# Patient Record
Sex: Female | Born: 1965
Health system: Southern US, Community
[De-identification: ages and names within clinical notes are randomized; demographics above are authoritative.]

## PROBLEM LIST (undated history)

## (undated) DIAGNOSIS — J189 Pneumonia, unspecified organism: Secondary | ICD-10-CM

## (undated) DIAGNOSIS — E079 Disorder of thyroid, unspecified: Secondary | ICD-10-CM

## (undated) DIAGNOSIS — G43909 Migraine, unspecified, not intractable, without status migrainosus: Secondary | ICD-10-CM

## (undated) DIAGNOSIS — K5792 Diverticulitis of intestine, part unspecified, without perforation or abscess without bleeding: Secondary | ICD-10-CM

## (undated) HISTORY — PX: TONSILLECTOMY: SUR1361

## (undated) HISTORY — DX: Disorder of thyroid, unspecified: E07.9

## (undated) HISTORY — PX: HERNIA REPAIR: SHX51

## (undated) HISTORY — PX: GALLBLADDER SURGERY: SHX652

## (undated) HISTORY — PX: BUNIONECTOMY: SHX129

## (undated) HISTORY — PX: ABDOMINAL HYSTERECTOMY: SHX81

---

## 2018-04-30 DIAGNOSIS — M9903 Segmental and somatic dysfunction of lumbar region: Secondary | ICD-10-CM | POA: Diagnosis not present

## 2018-04-30 DIAGNOSIS — M545 Low back pain: Secondary | ICD-10-CM | POA: Diagnosis not present

## 2018-04-30 DIAGNOSIS — M9901 Segmental and somatic dysfunction of cervical region: Secondary | ICD-10-CM | POA: Diagnosis not present

## 2018-04-30 DIAGNOSIS — M542 Cervicalgia: Secondary | ICD-10-CM | POA: Diagnosis not present

## 2018-05-01 DIAGNOSIS — M9901 Segmental and somatic dysfunction of cervical region: Secondary | ICD-10-CM | POA: Diagnosis not present

## 2018-05-01 DIAGNOSIS — M542 Cervicalgia: Secondary | ICD-10-CM | POA: Diagnosis not present

## 2018-05-01 DIAGNOSIS — M9903 Segmental and somatic dysfunction of lumbar region: Secondary | ICD-10-CM | POA: Diagnosis not present

## 2018-05-01 DIAGNOSIS — M545 Low back pain: Secondary | ICD-10-CM | POA: Diagnosis not present

## 2018-06-02 ENCOUNTER — Ambulatory Visit (HOSPITAL_COMMUNITY)
Admission: EM | Admit: 2018-06-02 | Discharge: 2018-06-02 | Disposition: A | Discharge status: Home / Self Care | Payer: BLUE CROSS/BLUE SHIELD | Attending: Family Medicine | Admitting: Family Medicine

## 2018-06-02 ENCOUNTER — Encounter (HOSPITAL_COMMUNITY): Payer: Self-pay | Admitting: Emergency Medicine

## 2018-06-02 DIAGNOSIS — Z760 Encounter for issue of repeat prescription: Secondary | ICD-10-CM

## 2018-06-02 DIAGNOSIS — G43709 Chronic migraine without aura, not intractable, without status migrainosus: Secondary | ICD-10-CM

## 2018-06-02 DIAGNOSIS — IMO0002 Reserved for concepts with insufficient information to code with codable children: Secondary | ICD-10-CM

## 2018-06-02 HISTORY — DX: Migraine, unspecified, not intractable, without status migrainosus: G43.909

## 2018-06-02 MED ORDER — BUSPIRONE HCL 10 MG PO TABS: 10.0000 mg | ORAL_TABLET | Freq: Two times a day (BID) | ORAL | 0 refills | Status: DC

## 2018-06-02 MED ORDER — AMITRIPTYLINE HCL 75 MG PO TABS: 75.0000 mg | ORAL_TABLET | Freq: Every day | ORAL | 0 refills | Status: DC

## 2018-06-02 MED ORDER — TOPIRAMATE 50 MG PO TABS: 100.0000 mg | ORAL_TABLET | Freq: Every day | ORAL | 0 refills | Status: DC

## 2018-06-02 NOTE — ED Triage Notes (Signed)
PT is new to [place] and has tried to find a PCP without luck. PT needs several meds refilled.

## 2018-06-02 NOTE — Discharge Instructions (Addendum)
Please continue to work to establish with a primary care provider as unable to manage medications for you long term.

## 2018-06-04 NOTE — ED Provider Notes (Signed)
[facility]    CSN: [id number] Arrival date & time: 06/02/18  1710     History   Chief Complaint Chief Complaint  Patient presents with  . Medication Refill    HPI Mary Spencer is a 52 y.o. female.   Mary Spencer presents with requests for medication refill. States she moved here a few months ago and has not been able to establish with a PCP and/or neurologist. Has followed with neurologist for chronic migraines in the past which present as dizziness/vertigo type symptoms. Still has a few days left of her medications but almost out. States she also gets injections to the base of her head which she has not been getting. Pill bottles brought it with her for review. Denies any change in her headaches currently.     ROS per HPI.      Past Medical History:  Diagnosis Date  . Migraines     There are no active problems to display for this patient.   Past Surgical History:  Procedure Laterality Date  . ABDOMINAL HYSTERECTOMY    . HERNIA REPAIR    . TONSILLECTOMY      OB History   None      Home Medications    Prior to Admission medications   Medication Sig Start Date End Date Taking? Authorizing Provider  amitriptyline (ELAVIL) 75 MG tablet Take 1 tablet (75 mg total) by mouth at bedtime. 06/02/18   Georgetta Haber, NP  busPIRone (BUSPAR) 10 MG tablet Take 1 tablet (10 mg total) by mouth 2 (two) times daily. 06/02/18   Georgetta Haber, NP  topiramate (TOPAMAX) 50 MG tablet Take 2 tablets (100 mg total) by mouth at bedtime. 06/02/18   Georgetta Haber, NP    Family History No family history on file.  Social History Social History   Tobacco Use  . Smoking status: Not on file  Substance Use Topics  . Alcohol use: Not on file  . Drug use: Not on file     Allergies   Penicillins; Erythromycin; Keflex [cephalexin]; and Sulfa antibiotics   Review of Systems Review of Systems   Physical Exam Triage Vital Signs ED Triage Vitals  Enc Vitals  Group     BP 06/02/18 1811 114/72     Pulse Rate 06/02/18 1811 66     Resp 06/02/18 1811 16     Temp 06/02/18 1811 97.8 F (36.6 C)     Temp Source 06/02/18 1811 Oral     SpO2 06/02/18 1811 100 %     Weight 06/02/18 1809 182 lb 9.6 oz (82.8 kg)     Height --      Head Circumference --      Peak Flow --      Pain Score 06/02/18 1808 3     Pain Loc --      Pain Edu? --      Excl. in GC? --    No data found.  Updated Vital Signs BP 114/72   Pulse 66   Temp 97.8 F (36.6 C) (Oral)   Resp 16   Wt 182 lb 9.6 oz (82.8 kg)   SpO2 100%    Physical Exam  Constitutional: She is oriented to person, place, and time. She appears well-developed and well-nourished. No distress.  Cardiovascular: Normal rate, regular rhythm and normal heart sounds.  Pulmonary/Chest: Effort normal and breath sounds normal.  Neurological: She is alert and oriented to person, place, and time. No cranial nerve deficit.  Coordination normal.  Skin: Skin is warm and dry.     UC Treatments / Results  Labs (all labs ordered are listed, but only abnormal results are displayed) Labs Reviewed - No data to display  EKG None  Radiology No results found.  Procedures Procedures (including critical care time)  Medications Ordered in UC Medications - No data to display  Initial Impression / Assessment and Plan / UC Course  I have reviewed the triage vital signs and the nursing notes.  Pertinent labs & imaging results that were available during my care of the patient were reviewed by me and considered in my medical decision making (see chart for details).     Elavil, buspar and topamax refilled for 30 days. Discussed at length that we are unable to manage these long term for her and emphasized the importance of following with a PCP and/or neurologist for management. Does appear she has an appointment upcoming with [facility] as well. Return precautions provided. Patient verbalized  understanding and agreeable to plan.    Final Clinical Impressions(s) / UC Diagnoses   Final diagnoses:  Medication refill  Chronic migraine     Discharge Instructions     Please continue to work to establish with a primary care provider as unable to manage medications for you long term.    ED Prescriptions    Medication Sig Dispense Auth. Provider   amitriptyline (ELAVIL) 75 MG tablet Take 1 tablet (75 mg total) by mouth at bedtime. 30 tablet Linus Mako B, NP   busPIRone (BUSPAR) 10 MG tablet Take 1 tablet (10 mg total) by mouth 2 (two) times daily. 60 tablet Linus Mako B, NP   topiramate (TOPAMAX) 50 MG tablet Take 2 tablets (100 mg total) by mouth at bedtime. 30 tablet Georgetta Haber, NP     Controlled Substance Prescriptions [place] Controlled Substance Registry consulted? Not Applicable   Georgetta Haber, NP 06/04/18 [phone]

## 2018-06-05 DIAGNOSIS — M9903 Segmental and somatic dysfunction of lumbar region: Secondary | ICD-10-CM | POA: Diagnosis not present

## 2018-06-05 DIAGNOSIS — M9902 Segmental and somatic dysfunction of thoracic region: Secondary | ICD-10-CM | POA: Diagnosis not present

## 2018-06-05 DIAGNOSIS — M9905 Segmental and somatic dysfunction of pelvic region: Secondary | ICD-10-CM | POA: Diagnosis not present

## 2018-06-05 DIAGNOSIS — M9901 Segmental and somatic dysfunction of cervical region: Secondary | ICD-10-CM | POA: Diagnosis not present

## 2018-06-10 DIAGNOSIS — M9903 Segmental and somatic dysfunction of lumbar region: Secondary | ICD-10-CM | POA: Diagnosis not present

## 2018-06-10 DIAGNOSIS — M9902 Segmental and somatic dysfunction of thoracic region: Secondary | ICD-10-CM | POA: Diagnosis not present

## 2018-06-10 DIAGNOSIS — M9905 Segmental and somatic dysfunction of pelvic region: Secondary | ICD-10-CM | POA: Diagnosis not present

## 2018-06-10 DIAGNOSIS — M9901 Segmental and somatic dysfunction of cervical region: Secondary | ICD-10-CM | POA: Diagnosis not present

## 2018-06-12 ENCOUNTER — Encounter: Payer: Self-pay | Admitting: Emergency Medicine

## 2018-06-12 ENCOUNTER — Other Ambulatory Visit: Payer: Self-pay

## 2018-06-12 ENCOUNTER — Ambulatory Visit: Payer: BLUE CROSS/BLUE SHIELD | Admitting: Emergency Medicine

## 2018-06-12 VITALS — BP 113/74 | HR 74 | Temp 97.9°F | Resp 18 | Ht 66.0 in | Wt 186.2 lb

## 2018-06-12 DIAGNOSIS — G43709 Chronic migraine without aura, not intractable, without status migrainosus: Secondary | ICD-10-CM | POA: Diagnosis not present

## 2018-06-12 DIAGNOSIS — Z23 Encounter for immunization: Secondary | ICD-10-CM | POA: Diagnosis not present

## 2018-06-12 DIAGNOSIS — Z7689 Persons encountering health services in other specified circumstances: Secondary | ICD-10-CM

## 2018-06-12 DIAGNOSIS — R42 Dizziness and giddiness: Secondary | ICD-10-CM | POA: Diagnosis not present

## 2018-06-12 MED ORDER — BUSPIRONE HCL 10 MG PO TABS: 10.0000 mg | ORAL_TABLET | Freq: Two times a day (BID) | ORAL | 1 refills | Status: AC

## 2018-06-12 MED ORDER — AMITRIPTYLINE HCL 75 MG PO TABS: 75.0000 mg | ORAL_TABLET | Freq: Every day | ORAL | 1 refills | Status: DC

## 2018-06-12 MED ORDER — TOPIRAMATE 50 MG PO TABS: 100.0000 mg | ORAL_TABLET | Freq: Every day | ORAL | 1 refills | Status: DC

## 2018-06-12 NOTE — Progress Notes (Signed)
Mary Spencer 52 y.o.   Chief Complaint  Patient presents with  . Establish Care  . Medication Refill    all meds     HISTORY OF PRESENT ILLNESS: This is a 52 y.o. female recently moved from [place] to this area.  Here to establish care.  Has a history of chronic migraine headaches presenting with dizzy spells and vertigo.  Also has a history of chronic anxiety.  Needs medication refills.  Has seen neurologist in the past.  HPI   Prior to Admission medications   Medication Sig Start Date End Date Taking? Authorizing Provider  amitriptyline (ELAVIL) 75 MG tablet Take 1 tablet (75 mg total) by mouth at bedtime. 06/02/18  Yes Burky, Dorene Grebe B, NP  busPIRone (BUSPAR) 10 MG tablet Take 1 tablet (10 mg total) by mouth 2 (two) times daily. 06/02/18  Yes Burky, Dorene Grebe B, NP  topiramate (TOPAMAX) 50 MG tablet Take 2 tablets (100 mg total) by mouth at bedtime. 06/02/18  Yes Georgetta Haber, NP    Allergies  Allergen Reactions  . Penicillins Anaphylaxis  . Erythromycin Hives  . Keflex [Cephalexin] Hives  . Sulfa Antibiotics Hives    There are no active problems to display for this patient.   Past Medical History:  Diagnosis Date  . Migraines   . Thyroid disease     Past Surgical History:  Procedure Laterality Date  . ABDOMINAL HYSTERECTOMY    . HERNIA REPAIR    . TONSILLECTOMY      Social History   Socioeconomic History  . Marital status: Married    Spouse name: Not on file  . Number of children: Not on file  . Years of education: Not on file  . Highest education level: Not on file  Occupational History  . Not on file  Social Needs  . Financial resource strain: Not on file  . Food insecurity:    Worry: Not on file    Inability: Not on file  . Transportation needs:    Medical: Not on file    Non-medical: Not on file  Tobacco Use  . Smoking status: Current Every Day Smoker    Types: Cigarettes  . Smokeless tobacco: Never Used  Substance and Sexual Activity    . Alcohol use: Not on file  . Drug use: Not on file  . Sexual activity: Not on file  Lifestyle  . Physical activity:    Days per week: Not on file    Minutes per session: Not on file  . Stress: Not on file  Relationships  . Social connections:    Talks on phone: Not on file    Gets together: Not on file    Attends religious service: Not on file    Active member of club or organization: Not on file    Attends meetings of clubs or organizations: Not on file    Relationship status: Not on file  . Intimate partner violence:    Fear of current or ex partner: Not on file    Emotionally abused: Not on file    Physically abused: Not on file    Forced sexual activity: Not on file  Other Topics Concern  . Not on file  Social History Narrative  . Not on file    No family history on file.   Review of Systems  Constitutional: Negative.  Negative for chills and fever.  HENT: Negative.   Eyes: Negative.  Negative for blurred vision and double vision.  Respiratory:  Negative.   Cardiovascular: Negative.   Gastrointestinal: Negative.  Negative for nausea and vomiting.  Skin: Negative.  Negative for rash.  Neurological: Positive for dizziness and headaches (Infrequently).    Vitals:   06/12/18 1407  BP: 113/74  Pulse: 74  Resp: 18  Temp: 97.9 F (36.6 C)  SpO2: 100%    Physical Exam  Constitutional: She is oriented to person, place, and time. She appears well-developed and well-nourished.  HENT:  Head: Normocephalic.  Right Ear: External ear normal.  Left Ear: External ear normal.  Nose: Nose normal.  Mouth/Throat: Oropharynx is clear and moist.  Eyes: Pupils are equal, round, and reactive to light. Conjunctivae and EOM are normal.  Neck: Normal range of motion. Neck supple.  Cardiovascular: Normal rate, regular rhythm and normal heart sounds.  Pulmonary/Chest: Effort normal and breath sounds normal.  Musculoskeletal: Normal range of motion.  Neurological: She is alert  and oriented to person, place, and time. No sensory deficit. She exhibits normal muscle tone.  Skin: Skin is warm and dry. Capillary refill takes less than 2 seconds.  Psychiatric: She has a normal mood and affect. Her behavior is normal.  Vitals reviewed.    ASSESSMENT & PLAN: Rachyl was seen today for establish care and medication refill.  Diagnoses and all orders for this visit:  Chronic migraine without aura without status migrainosus, not intractable -     Ambulatory referral to Neurology -     topiramate (TOPAMAX) 50 MG tablet; Take 2 tablets (100 mg total) by mouth at bedtime. -     busPIRone (BUSPAR) 10 MG tablet; Take 1 tablet (10 mg total) by mouth 2 (two) times daily. -     amitriptyline (ELAVIL) 75 MG tablet; Take 1 tablet (75 mg total) by mouth at bedtime.  Flu vaccine need -     Flu Vaccine QUAD 36+ mos IM  Chronic vertigo  Encounter to establish care    Patient Instructions       If you have lab work done today you will be contacted with your lab results within the next 2 weeks.  If you have not heard from [place] then please contact us. The fastest way to get your results is to register for My Chart.   IF you received an x-ray today, you will receive an invoice from [facility]. Please contact [facility] at [phone] with questions or concerns regarding your invoice.   IF you received labwork today, you will receive an invoice from [facility]. Please contact [facility] at [phone] with questions or concerns regarding your invoice.   Our billing staff will not be able to assist you with questions regarding bills from these companies.  You will be contacted with the lab results as soon as they are available. The fastest way to get your results is to activate your My Chart account. Instructions are located on the last page of this paperwork. If you have not heard from [place] regarding the results in 2 weeks, please contact this office.      Migraine Headache A migraine headache is a very strong throbbing pain on one side or both sides of your head. Migraines can also cause other symptoms. Talk with your doctor about what things may bring on (trigger) your migraine headaches. Follow these instructions at home: Medicines  Take over-the-counter and prescription medicines only as told by your doctor.  Do not drive or use heavy machinery while taking prescription pain medicine.  To prevent or treat constipation while you  are taking prescription pain medicine, your doctor may recommend that you: ? Drink enough fluid to keep your pee (urine) clear or pale yellow. ? Take over-the-counter or prescription medicines. ? Eat foods that are high in fiber. These include fresh fruits and vegetables, whole grains, and beans. ? Limit foods that are high in fat and processed sugars. These include fried and sweet foods. Lifestyle  Avoid alcohol.  Do not use any products that contain nicotine or tobacco, such as cigarettes and e-cigarettes. If you need help quitting, ask your doctor.  Get at least 8 hours of sleep every night.  Limit your stress. General instructions   Keep a journal to find out what may bring on your migraines. For example, write down: ? What you eat and drink. ? How much sleep you get. ? Any change in what you eat or drink. ? Any change in your medicines.  If you have a migraine: ? Avoid things that make your symptoms worse, such as bright lights. ? It may help to lie down in a dark, quiet room. ? Do not drive or use heavy machinery. ? Ask your doctor what activities are safe for you.  Keep all follow-up visits as told by your doctor. This is important. Contact a doctor if:  You get a migraine that is different or worse than your usual migraines. Get help right away if:  Your migraine gets very bad.  You have a fever.  You have a stiff neck.  You have trouble seeing.  Your muscles feel weak or  like you cannot control them.  You start to lose your balance a lot.  You start to have trouble walking.  You pass out (faint). This information is not intended to replace advice given to you by your health care provider. Make sure you discuss any questions you have with your health care provider. Document Released: 05/29/2008 Document Revised: 03/09/2016 Document Reviewed: 02/06/2016 Elsevier Interactive Patient Education  2018 Elsevier Inc.       Edwina Barth, MD [facility]

## 2018-06-12 NOTE — Patient Instructions (Addendum)
If you have lab work done today you will be contacted with your lab results within the next 2 weeks.  If you have not heard from [place] then please contact us. The fastest way to get your results is to register for My Chart.   IF you received an x-ray today, you will receive an invoice from [facility]. Please contact [facility] at [phone] with questions or concerns regarding your invoice.   IF you received labwork today, you will receive an invoice from [facility]. Please contact [facility] at [phone] with questions or concerns regarding your invoice.   Our billing staff will not be able to assist you with questions regarding bills from these companies.  You will be contacted with the lab results as soon as they are available. The fastest way to get your results is to activate your My Chart account. Instructions are located on the last page of this paperwork. If you have not heard from [place] regarding the results in 2 weeks, please contact this office.     Migraine Headache A migraine headache is a very strong throbbing pain on one side or both sides of your head. Migraines can also cause other symptoms. Talk with your doctor about what things may bring on (trigger) your migraine headaches. Follow these instructions at home: Medicines  Take over-the-counter and prescription medicines only as told by your doctor.  Do not drive or use heavy machinery while taking prescription pain medicine.  To prevent or treat constipation while you are taking prescription pain medicine, your doctor may recommend that you: ? Drink enough fluid to keep your pee (urine) clear or pale yellow. ? Take over-the-counter or prescription medicines. ? Eat foods that are high in fiber. These include fresh fruits and vegetables, whole grains, and beans. ? Limit foods that are high in fat and processed sugars. These include fried and sweet foods. Lifestyle  Avoid alcohol.  Do not use any  products that contain nicotine or tobacco, such as cigarettes and e-cigarettes. If you need help quitting, ask your doctor.  Get at least 8 hours of sleep every night.  Limit your stress. General instructions   Keep a journal to find out what may bring on your migraines. For example, write down: ? What you eat and drink. ? How much sleep you get. ? Any change in what you eat or drink. ? Any change in your medicines.  If you have a migraine: ? Avoid things that make your symptoms worse, such as bright lights. ? It may help to lie down in a dark, quiet room. ? Do not drive or use heavy machinery. ? Ask your doctor what activities are safe for you.  Keep all follow-up visits as told by your doctor. This is important. Contact a doctor if:  You get a migraine that is different or worse than your usual migraines. Get help right away if:  Your migraine gets very bad.  You have a fever.  You have a stiff neck.  You have trouble seeing.  Your muscles feel weak or like you cannot control them.  You start to lose your balance a lot.  You start to have trouble walking.  You pass out (faint). This information is not intended to replace advice given to you by your health care provider. Make sure you discuss any questions you have with your health care provider. Document Released: 05/29/2008 Document Revised: 03/09/2016 Document Reviewed: 02/06/2016 Elsevier Interactive Patient Education  2018 [employer].  Coping with Quitting Smoking Quitting smoking is a physical and mental challenge. You will face cravings, withdrawal symptoms, and temptation. Before quitting, work with your health care provider to make a plan that can help you cope. Preparation can help you quit and keep you from giving in. How can I cope with cravings? Cravings usually last for 5-10 minutes. If you get through it, the craving will pass. Consider taking the following actions to help you cope with  cravings:  Keep your mouth busy: ? Chew sugar-free gum. ? Suck on hard candies or a straw. ? Brush your teeth.  Keep your hands and body busy: ? Immediately change to a different activity when you feel a craving. ? Squeeze or play with a ball. ? Do an activity or a hobby, like making bead jewelry, practicing needlepoint, or working with wood. ? Mix up your normal routine. ? Take a short exercise break. Go for a quick walk or run up and down stairs. ? Spend time in public places where smoking is not allowed.  Focus on doing something kind or helpful for someone else.  Call a friend or family member to talk during a craving.  Join a support group.  Call a quit line, such as 1-800-QUIT-NOW.  Talk with your health care provider about medicines that might help you cope with cravings and make quitting easier for you.  How can I deal with withdrawal symptoms? Your body may experience negative effects as it tries to get used to not having nicotine in the system. These effects are called withdrawal symptoms. They may include:  Feeling hungrier than normal.  Trouble concentrating.  Irritability.  Trouble sleeping.  Feeling depressed.  Restlessness and agitation.  Craving a cigarette.  To manage withdrawal symptoms:  Avoid places, people, and activities that trigger your cravings.  Remember why you want to quit.  Get plenty of sleep.  Avoid coffee and other caffeinated drinks. These may worsen some of your symptoms.  How can I handle social situations? Social situations can be difficult when you are quitting smoking, especially in the first few weeks. To manage this, you can:  Avoid parties, bars, and other social situations where people might be smoking.  Avoid alcohol.  Leave right away if you have the urge to smoke.  Explain to your family and friends that you are quitting smoking. Ask for understanding and support.  Plan activities with friends or family where  smoking is not an option.  What are some ways I can cope with stress? Wanting to smoke may cause stress, and stress can make you want to smoke. Find ways to manage your stress. Relaxation techniques can help. For example:  Breathe slowly and deeply, in through your nose and out through your mouth.  Listen to soothing, relaxing music.  Talk with a family member or friend about your stress.  Light a candle.  Soak in a bath or take a shower.  Think about a peaceful place.  What are some ways I can prevent weight gain? Be aware that many people gain weight after they quit smoking. However, not everyone does. To keep from gaining weight, have a plan in place before you quit and stick to the plan after you quit. Your plan should include:  Having healthy snacks. When you have a craving, it may help to: ? Eat plain popcorn, crunchy carrots, celery, or other cut vegetables. ? Chew sugar-free gum.  Changing how you eat: ? Eat small portion sizes at meals. ?  Eat 4-6 small meals throughout the day instead of 1-2 large meals a day. ? Be mindful when you eat. Do not watch television or do other things that might distract you as you eat.  Exercising regularly: ? Make time to exercise each day. If you do not have time for a long workout, do short bouts of exercise for 5-10 minutes several times a day. ? Do some form of strengthening exercise, like weight lifting, and some form of aerobic exercise, like running or swimming.  Drinking plenty of water or other low-calorie or no-calorie drinks. Drink 6-8 glasses of water daily, or as much as instructed by your health care provider.  Summary  Quitting smoking is a physical and mental challenge. You will face cravings, withdrawal symptoms, and temptation to smoke again. Preparation can help you as you go through these challenges.  You can cope with cravings by keeping your mouth busy (such as by chewing gum), keeping your body and hands busy, and  making calls to family, friends, or a helpline for people who want to quit smoking.  You can cope with withdrawal symptoms by avoiding places where people smoke, avoiding drinks with caffeine, and getting plenty of rest.  Ask your health care provider about the different ways to prevent weight gain, avoid stress, and handle social situations. This information is not intended to replace advice given to you by your health care provider. Make sure you discuss any questions you have with your health care provider. Document Released: 08/17/2016 Document Revised: 08/17/2016 Document Reviewed: 08/17/2016 Elsevier Interactive Patient Education  [employer].

## 2018-06-17 DIAGNOSIS — M9902 Segmental and somatic dysfunction of thoracic region: Secondary | ICD-10-CM | POA: Diagnosis not present

## 2018-06-17 DIAGNOSIS — M9901 Segmental and somatic dysfunction of cervical region: Secondary | ICD-10-CM | POA: Diagnosis not present

## 2018-06-17 DIAGNOSIS — M9903 Segmental and somatic dysfunction of lumbar region: Secondary | ICD-10-CM | POA: Diagnosis not present

## 2018-06-17 DIAGNOSIS — M9905 Segmental and somatic dysfunction of pelvic region: Secondary | ICD-10-CM | POA: Diagnosis not present

## 2018-06-23 ENCOUNTER — Ambulatory Visit: Payer: Self-pay | Admitting: Nurse Practitioner

## 2018-06-25 DIAGNOSIS — M9903 Segmental and somatic dysfunction of lumbar region: Secondary | ICD-10-CM | POA: Diagnosis not present

## 2018-06-25 DIAGNOSIS — M9901 Segmental and somatic dysfunction of cervical region: Secondary | ICD-10-CM | POA: Diagnosis not present

## 2018-06-25 DIAGNOSIS — M9905 Segmental and somatic dysfunction of pelvic region: Secondary | ICD-10-CM | POA: Diagnosis not present

## 2018-06-25 DIAGNOSIS — M9902 Segmental and somatic dysfunction of thoracic region: Secondary | ICD-10-CM | POA: Diagnosis not present

## 2018-07-08 DIAGNOSIS — M9901 Segmental and somatic dysfunction of cervical region: Secondary | ICD-10-CM | POA: Diagnosis not present

## 2018-07-08 DIAGNOSIS — M9902 Segmental and somatic dysfunction of thoracic region: Secondary | ICD-10-CM | POA: Diagnosis not present

## 2018-07-08 DIAGNOSIS — M9905 Segmental and somatic dysfunction of pelvic region: Secondary | ICD-10-CM | POA: Diagnosis not present

## 2018-07-08 DIAGNOSIS — M9903 Segmental and somatic dysfunction of lumbar region: Secondary | ICD-10-CM | POA: Diagnosis not present

## 2018-07-16 DIAGNOSIS — M9903 Segmental and somatic dysfunction of lumbar region: Secondary | ICD-10-CM | POA: Diagnosis not present

## 2018-07-16 DIAGNOSIS — M9905 Segmental and somatic dysfunction of pelvic region: Secondary | ICD-10-CM | POA: Diagnosis not present

## 2018-07-16 DIAGNOSIS — M9902 Segmental and somatic dysfunction of thoracic region: Secondary | ICD-10-CM | POA: Diagnosis not present

## 2018-07-16 DIAGNOSIS — M9901 Segmental and somatic dysfunction of cervical region: Secondary | ICD-10-CM | POA: Diagnosis not present

## 2018-07-23 DIAGNOSIS — M9901 Segmental and somatic dysfunction of cervical region: Secondary | ICD-10-CM | POA: Diagnosis not present

## 2018-07-23 DIAGNOSIS — M9905 Segmental and somatic dysfunction of pelvic region: Secondary | ICD-10-CM | POA: Diagnosis not present

## 2018-07-23 DIAGNOSIS — M9902 Segmental and somatic dysfunction of thoracic region: Secondary | ICD-10-CM | POA: Diagnosis not present

## 2018-07-23 DIAGNOSIS — M9903 Segmental and somatic dysfunction of lumbar region: Secondary | ICD-10-CM | POA: Diagnosis not present

## 2018-08-05 ENCOUNTER — Ambulatory Visit: Payer: BLUE CROSS/BLUE SHIELD | Admitting: Neurology

## 2018-08-05 DIAGNOSIS — M9903 Segmental and somatic dysfunction of lumbar region: Secondary | ICD-10-CM | POA: Diagnosis not present

## 2018-08-05 DIAGNOSIS — M9902 Segmental and somatic dysfunction of thoracic region: Secondary | ICD-10-CM | POA: Diagnosis not present

## 2018-08-05 DIAGNOSIS — M9905 Segmental and somatic dysfunction of pelvic region: Secondary | ICD-10-CM | POA: Diagnosis not present

## 2018-08-05 DIAGNOSIS — M9901 Segmental and somatic dysfunction of cervical region: Secondary | ICD-10-CM | POA: Diagnosis not present

## 2018-08-14 DIAGNOSIS — M9901 Segmental and somatic dysfunction of cervical region: Secondary | ICD-10-CM | POA: Diagnosis not present

## 2018-08-14 DIAGNOSIS — M9903 Segmental and somatic dysfunction of lumbar region: Secondary | ICD-10-CM | POA: Diagnosis not present

## 2018-08-14 DIAGNOSIS — M9905 Segmental and somatic dysfunction of pelvic region: Secondary | ICD-10-CM | POA: Diagnosis not present

## 2018-08-14 DIAGNOSIS — M9902 Segmental and somatic dysfunction of thoracic region: Secondary | ICD-10-CM | POA: Diagnosis not present

## 2018-09-04 DIAGNOSIS — M9901 Segmental and somatic dysfunction of cervical region: Secondary | ICD-10-CM | POA: Diagnosis not present

## 2018-09-04 DIAGNOSIS — M9905 Segmental and somatic dysfunction of pelvic region: Secondary | ICD-10-CM | POA: Diagnosis not present

## 2018-09-04 DIAGNOSIS — M9903 Segmental and somatic dysfunction of lumbar region: Secondary | ICD-10-CM | POA: Diagnosis not present

## 2018-09-04 DIAGNOSIS — M9902 Segmental and somatic dysfunction of thoracic region: Secondary | ICD-10-CM | POA: Diagnosis not present

## 2018-09-11 DIAGNOSIS — M9903 Segmental and somatic dysfunction of lumbar region: Secondary | ICD-10-CM | POA: Diagnosis not present

## 2018-09-11 DIAGNOSIS — M9905 Segmental and somatic dysfunction of pelvic region: Secondary | ICD-10-CM | POA: Diagnosis not present

## 2018-09-11 DIAGNOSIS — M9901 Segmental and somatic dysfunction of cervical region: Secondary | ICD-10-CM | POA: Diagnosis not present

## 2018-09-11 DIAGNOSIS — M9902 Segmental and somatic dysfunction of thoracic region: Secondary | ICD-10-CM | POA: Diagnosis not present

## 2018-09-30 NOTE — Progress Notes (Signed)
[facility]    Provider:  Dr Lucia Gaskins Referring Provider: Georgina Quint, * Primary Care Physician:  Georgina Quint, *  CC:  Migraines  HPI:  Mary Spencer is a 53 y.o. female here as requested by Dr. Alvy Bimler for chronic migraines.  Past medical history of chronic migraines presenting with dizzy spells and vertigo, chronic anxiety.  She has been managed by neurology in the past for her migraines.  She also has a history of thyroid disease.She has been to ENT multiple times and several of them, she has had extensive cardiac workup. 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 She can have headaches. Maxalt works well. Headaches are pulsating and pounding, light and sound sensitivity and nausea and vomiting.  She has daily vertigo and dizziness, it is just normal to her and she will set her hand on something and sits a lot, She has headaches which are minor 12 days a month and her main symptoms daily are vestibular. Nerve blocks helped in the past every 3 months occipital nerve blocks. She ses a [facility]. She had a neurologist in [place]. She sees Dr. Lois Huxley chiropractor. Migraines can last 24-72 hours. She is on Topiramate and Alavil. No new symptoms or focal deficits, no hearing changes, no vision changes. She had had extended eeg, multiple MRIs. No other focal neurologic deficits, associated symptoms, inciting events or modifiable factors.  Meds tried: Elavil, Topiramate  Reviewed notes, labs and imaging from outside physicians, which showed:  Reviewed emergency room notes patient was seen in September 2019.  Patient moved to the area several months prior and had not been able to establish with a PCP.  She is followed with a neurologist for  chronic migraine in the past which presented as dizziness and vertigo type symptoms.  Also stated she gets injections to the base of her head which she has not been getting.  No changes in her headaches currently.  She is on amitriptyline, topiramate and BuSpar.  Vitals, labs and exam were normal in the emergency room.  Patient was seen at Dr. Latrelle Dodrill office to establish care in October 2019.  She recently moved to this area from [place].  Has a history of chronic migraines presenting with dizzy spells and vertigo.  Also has a history of chronic anxiety.  Has seen neurology in the past.  Stated she had dizziness and headaches infrequently.  Physical exam and neurologic exam was normal.  No changes were made to her medications.  Will request notes from prior neurologist's office  Review of Systems: Patient complains of symptoms per HPI as well as the following symptoms: headache, dizziness. Pertinent negatives and positives per HPI. All others negative.   Social History   Socioeconomic History  . Marital status: Married    Spouse name: Not on file  . Number of children: 2  . Years of education: Not on file  . Highest education level: Bachelor's degree (e.g., BA, AB, BS)  Occupational History  . Not on file  Social Needs  . Financial resource strain: Not on file  . Food insecurity:    Worry: Not on file    Inability: Not on file  . Transportation needs:    Medical: Not on file    Non-medical: Not on file  Tobacco  Use  . Smoking status: Current Every Day Smoker    Packs/day: 0.50    Types: Cigarettes  . Smokeless tobacco: Never Used  Substance and Sexual Activity  . Alcohol use: Yes    Comment: "very rarely, maybe 1 drink on a holiday"  . Drug use: Never  . Sexual activity: Not on file  Lifestyle  . Physical activity:    Days per week: Not on file    Minutes per session: Not on file  . Stress: Not on file  Relationships  . Social connections:    Talks on phone: Not on  file    Gets together: Not on file    Attends religious service: Not on file    Active member of club or organization: Not on file    Attends meetings of clubs or organizations: Not on file    Relationship status: Not on file  . Intimate partner violence:    Fear of current or ex partner: Not on file    Emotionally abused: Not on file    Physically abused: Not on file    Forced sexual activity: Not on file  Other Topics Concern  . Not on file  Social History Narrative   Lives at home with her husband    Right handed   Caffeine: 2-3 cups daily    Family History  Problem Relation Age of Onset  . Stroke Mother   . Seizures Mother   . Cancer Mother   . Macular degeneration Father   . Migraines Sister        cluster  . Stroke Maternal Grandmother   . Seizures Maternal Grandmother   . Cancer Maternal Grandfather   . Migraines Sister        cluster    Past Medical History:  Diagnosis Date  . Migraines   . Thyroid disease     Past Surgical History:  Procedure Laterality Date  . ABDOMINAL HYSTERECTOMY    . HERNIA REPAIR    . TONSILLECTOMY      Current Outpatient Medications  Medication Sig Dispense Refill  . amitriptyline (ELAVIL) 75 MG tablet Take 1 tablet (75 mg total) by mouth at bedtime. 90 tablet 1  . busPIRone (BUSPAR) 10 MG tablet Take 10 mg by mouth 2 (two) times daily.    Marland Kitchen. topiramate (TOPAMAX) 50 MG tablet Take 2 tablets (100 mg total) by mouth at bedtime. 180 tablet 1  . Erenumab-aooe (AIMOVIG, 140 MG DOSE,) 70 MG/ML SOAJ Inject 140 mg into the skin every 30 (thirty) days. 1 pen 11   No current facility-administered medications for this visit.     Allergies as of 10/01/2018 - Review Complete 10/01/2018  Allergen Reaction Noted  . Penicillins Anaphylaxis 06/02/2018  . Erythromycin Hives 06/02/2018  . Keflex [cephalexin] Hives 06/02/2018  . Sulfa antibiotics Hives 06/02/2018    Vitals: BP 102/63 (BP Location: Right Arm, Patient Position: Sitting)    Pulse 89   Ht 5\' 6"  (1.676 m)   Wt 187 lb (84.8 kg)   BMI 30.18 kg/m  Last Weight:  Wt Readings from Last 1 Encounters:  10/01/18 187 lb (84.8 kg)   Last Height:   Ht Readings from Last 1 Encounters:  10/01/18 5\' 6"  (1.676 m)    Physical exam: Exam: Gen: NAD, conversant, well nourised, obese, well groomed                     CV: RRR, no MRG. No Carotid Bruits. No peripheral  edema, warm, nontender Eyes: Conjunctivae clear without exudates or hemorrhage  Neuro: Detailed Neurologic Exam  Speech:    Speech is normal; fluent and spontaneous with normal comprehension.  Cognition:    The patient is oriented to person, place, and time;     recent and remote memory intact;     language fluent;     normal attention, concentration,     fund of knowledge Cranial Nerves:    The pupils are equal, round, and reactive to light. The fundi are normal and spontaneous venous pulsations are present. Visual fields are full to finger confrontation. Extraocular movements are intact. Trigeminal sensation is intact and the muscles of mastication are normal. The face is symmetric. The palate elevates in the midline. Hearing intact. Voice is normal. Shoulder shrug is normal. The tongue has normal motion without fasciculations.   Coordination:    Normal finger to nose and heel to shin. Normal rapid alternating movements.   Gait:    Heel-toe and tandem gait are normal.   Motor Observation:    No asymmetry, no atrophy, and no involuntary movements noted. Tone:    Normal muscle tone.    Posture:    Posture is normal. normal erect    Strength:    Strength is V/V in the upper and lower limbs.      Sensation: intact to LT     Reflex Exam:  DTR's:    Deep tendon reflexes in the upper and lower extremities are normal bilaterally.   Toes:    The toes are downgoing bilaterally.   Clonus:    Clonus is absent.    Assessment/Plan:  Patient with chronic migraines and associated dizziness or  "vestibular migraines" as it is coined. She has daily symptoms. Will try Aimovig, also discussed botox. There is no data these work in primarily vestibular-type migraines however it is worth a try.   continue current medications: Elavil, Topiramate Acutely: Maxalt and phenergan  Discussed: There is increased risk for stroke in women with migraine with aura and a  Contraindication for the combined contraceptive pill for use by women who have migraine with aura, which is in line with World Health Organisation recommendations. The risk for women with migraine without aura is lower and other risk factors like smoking are far more likely to increase stroke risk than migraine. There is a recommendation for no smoking and for the use of low estrogen or progestogen only pills particularly for women with migraine with aura. It is important however that women with migraine who are taking the pill do not decide to suddenly stop taking it without discussing this with their doctor. Please discuss with her OB/GYN.  Discussed: To prevent or relieve headaches, try the following: Cool Compress. Lie down and place a cool compress on your head.  Avoid headache triggers. If certain foods or odors seem to have triggered your migraines in the past, avoid them. A headache diary might help you identify triggers.  Include physical activity in your daily routine. Try a daily walk or other moderate aerobic exercise.  Manage stress. Find healthy ways to cope with the stressors, such as delegating tasks on your to-do list.  Practice relaxation techniques. Try deep breathing, yoga, massage and visualization.  Eat regularly. Eating regularly scheduled meals and maintaining a healthy diet might help prevent headaches. Also, drink plenty of fluids.  Follow a regular sleep schedule. Sleep deprivation might contribute to headaches Consider biofeedback. With this mind-body technique, you learn to control certain bodily  functions - such  as muscle tension, heart rate and blood pressure - to prevent headaches or reduce headache pain.    Proceed to emergency room if you experience new or worsening symptoms or symptoms do not resolve, if you have new neurologic symptoms or if headache is severe, or for any concerning symptom.   Provided education and documentation from American headache Society toolbox including articles on: chronic migraine medication overuse headache, chronic migraines, prevention of migraines, behavioral and other nonpharmacologic treatments for headache.  Meds ordered this encounter  Medications  . Erenumab-aooe (AIMOVIG, 140 MG DOSE,) 70 MG/ML SOAJ    Sig: Inject 140 mg into the skin every 30 (thirty) days.    Dispense:  1 pen    Refill:  11    Patient has a copay card and she can have the medication regardless of insurance approval or copay  . topiramate (TOPAMAX) 100 MG tablet    Sig: Take 1 tablet (100 mg total) by mouth at bedtime.    Dispense:  90 tablet    Refill:  4  . amitriptyline (ELAVIL) 75 MG tablet    Sig: Take 1 tablet (75 mg total) by mouth at bedtime.    Dispense:  90 tablet    Refill:  4  . rizatriptan (MAXALT-MLT) 10 MG disintegrating tablet    Sig: Take 1 tablet (10 mg total) by mouth as needed for migraine. May repeat in 2 hours if needed    Dispense:  27 tablet    Refill:  4    This is a 90-day supply do not fill early  . promethazine (PHENERGAN) 12.5 MG tablet    Sig: Take 1 tablet (12.5 mg total) by mouth every 6 (six) hours as needed for nausea or vomiting. May take with Maxalt (Rizatriptan).    Dispense:  90 tablet    Refill:  4     Naomie DeanAntonia Ilaisaane Marts, MD  [facility] [address] [place], [place] [id number]  Phone [phone] Fax [phone]

## 2018-10-01 ENCOUNTER — Encounter: Payer: Self-pay | Admitting: Neurology

## 2018-10-01 ENCOUNTER — Ambulatory Visit: Payer: BLUE CROSS/BLUE SHIELD | Admitting: Neurology

## 2018-10-01 VITALS — BP 102/63 | HR 89 | Ht 66.0 in | Wt 187.0 lb

## 2018-10-01 DIAGNOSIS — G43109 Migraine with aura, not intractable, without status migrainosus: Secondary | ICD-10-CM | POA: Diagnosis not present

## 2018-10-01 DIAGNOSIS — G43111 Migraine with aura, intractable, with status migrainosus: Secondary | ICD-10-CM | POA: Diagnosis not present

## 2018-10-01 DIAGNOSIS — G43809 Other migraine, not intractable, without status migrainosus: Secondary | ICD-10-CM | POA: Insufficient documentation

## 2018-10-01 DIAGNOSIS — G43709 Chronic migraine without aura, not intractable, without status migrainosus: Secondary | ICD-10-CM | POA: Diagnosis not present

## 2018-10-01 MED ORDER — ERENUMAB-AOOE 70 MG/ML ~~LOC~~ SOAJ: 140.0000 mg | SUBCUTANEOUS | 11 refills | Status: DC

## 2018-10-01 MED ORDER — PROMETHAZINE HCL 12.5 MG PO TABS: 12.5000 mg | ORAL_TABLET | Freq: Four times a day (QID) | ORAL | 4 refills | Status: DC | PRN

## 2018-10-01 MED ORDER — TOPIRAMATE 100 MG PO TABS: 100.0000 mg | ORAL_TABLET | Freq: Every day | ORAL | 4 refills | Status: DC

## 2018-10-01 MED ORDER — AMITRIPTYLINE HCL 75 MG PO TABS: 75.0000 mg | ORAL_TABLET | Freq: Every day | ORAL | 4 refills | Status: DC

## 2018-10-01 MED ORDER — RIZATRIPTAN BENZOATE 10 MG PO TBDP: 10.0000 mg | ORAL_TABLET | ORAL | 4 refills | Status: DC | PRN

## 2018-10-01 NOTE — Patient Instructions (Addendum)
Start Aimovig  Erenumab: Patient drug information [employer] here. Copyright [id number] [employer] All rights reserved. (For additional information see "Erenumab: Drug information") Brand Names: [place]  Aimovig;  Aimovig (140 MG Dose) [DSC]  Brand Names: [place]  Aimovig  What is this drug used for?   It is used to prevent migraine headaches.  What do I need to tell my doctor BEFORE I take this drug?   If you are allergic to this drug; any part of this drug; or any other drugs, foods, or substances. Tell your doctor about the allergy and what signs you had.   This drug may interact with other drugs or health problems.   Tell your doctor and pharmacist about all of your drugs (prescription or OTC, natural products, vitamins) and health problems. You must check to make sure that it is safe for you to take this drug with all of your drugs and health problems. Do not start, stop, or change the dose of any drug without checking with your doctor.  What are some things I need to know or do while I take this drug?   Tell all of your health care providers that you take this drug. This includes your doctors, nurses, pharmacists, and dentists.   If you have a latex allergy, talk with your doctor.   Tell your doctor if you are pregnant, plan on getting pregnant, or are breast-feeding. You will need to talk about the benefits and risks to you and the baby.  What are some side effects that I need to call my doctor about right away?   WARNING/CAUTION: Even though it may be rare, some people may have very bad and sometimes deadly side effects when taking a drug. Tell your doctor or get medical help right away if you have any of the following signs or symptoms that may be related to a very bad side effect:   Signs of an allergic reaction, like rash; hives; itching; red, swollen, blistered, or peeling skin with or without fever; wheezing; tightness in the chest or throat; trouble breathing,  swallowing, or talking; unusual hoarseness; or swelling of the mouth, face, lips, tongue, or throat.  What are some other side effects of this drug?   All drugs may cause side effects. However, many people have no side effects or only have minor side effects. Call your doctor or get medical help if any of these side effects or any other side effects bother you or do not go away:   Pain, redness, or swelling where the shot was given.   Constipation is common with this drug. In some people, severe constipation led to treatment in a hospital or surgery. Call your doctor right away if you have constipation that is severe or does not go away.   These are not all of the side effects that may occur. If you have questions about side effects, call your doctor. Call your doctor for medical advice about side effects.   You may report side effects to your national health agency.  How is this drug best taken?   Use this drug as ordered by your doctor. Read all information given to you. Follow all instructions closely.   It is given as a shot into the fatty part of the skin on the top of the thigh, belly area, or upper arm.   If you will be giving yourself the shot, your doctor or nurse will teach you how to give the shot.   If  stored in a refrigerator, let this drug come to room temperature before using it. Leave it at room temperature for at least 30 minutes. Do not heat this drug.   Protect from heat and sunlight.   Do not shake.   Do not give into skin within 2 inches of the belly button.   Do not give into skin that is irritated, tender, bruised, red, scaly, hard, scarred, or has stretch marks.   Do not use if the solution is cloudy, leaking, or has particles.   This drug is colorless to a faint yellow. Do not use if the solution changes color.   Throw away after using. Do not use the device more than 1 time.   Throw away needles in a needle/sharp disposal box. Do not reuse needles or other items. When  the box is full, follow all local rules for getting rid of it. Talk with a doctor or pharmacist if you have any questions.  What do I do if I miss a dose?   Take a missed dose as soon as you think about it.   After taking a missed dose, start a new schedule based on when the dose is taken.  How do I store and/or throw out this drug?   Store in a refrigerator. Do not freeze.   Store in the original container to protect from light.   Do not use if it has been frozen.   If you drop this drug on a hard surface, do not use it.   If needed, you may store at room temperature for up to 7 days. Write down the date you take this drug out of the refrigerator. If stored at room temperature and not used within 7 days, throw this drug away.   Do not put this drug back in the refrigerator after it has been stored at room temperature.   Keep all drugs in a safe place. Keep all drugs out of the reach of children and pets.   Throw away unused or expired drugs. Do not flush down a toilet or pour down a drain unless you are told to do so. Check with your pharmacist if you have questions about the best way to throw out drugs. There may be drug take-back programs in your area.  General drug facts   If your symptoms or health problems do not get better or if they become worse, call your doctor.   Do not share your drugs with others and do not take anyone else's drugs.   Some drugs may have another patient information leaflet. If you have any questions about this drug, please talk with your doctor, nurse, pharmacist, or other health care provider.   If you think there has been an overdose, call your poison control center or get medical care right away. Be ready to tell or show what was taken, how much, and when it happened.

## 2018-10-20 ENCOUNTER — Telehealth: Payer: Self-pay | Admitting: *Deleted

## 2018-10-20 NOTE — Telephone Encounter (Addendum)
Received a request asking if it is ok to dispense Aimovig 140 mg pen instead of the 70 mg pen. I faxed the request back and confirmed they should be dispensing the 140 mg pen per pt's active order in chart from Dr. Lucia Gaskins for Aimovig 140 mg/mL, 1 pen every 30 days. Received a receipt of confirmation.

## 2018-11-05 DIAGNOSIS — M9903 Segmental and somatic dysfunction of lumbar region: Secondary | ICD-10-CM | POA: Diagnosis not present

## 2018-11-05 DIAGNOSIS — M9905 Segmental and somatic dysfunction of pelvic region: Secondary | ICD-10-CM | POA: Diagnosis not present

## 2018-11-05 DIAGNOSIS — M9901 Segmental and somatic dysfunction of cervical region: Secondary | ICD-10-CM | POA: Diagnosis not present

## 2018-11-05 DIAGNOSIS — M9902 Segmental and somatic dysfunction of thoracic region: Secondary | ICD-10-CM | POA: Diagnosis not present

## 2018-11-12 DIAGNOSIS — M9903 Segmental and somatic dysfunction of lumbar region: Secondary | ICD-10-CM | POA: Diagnosis not present

## 2018-11-12 DIAGNOSIS — M9901 Segmental and somatic dysfunction of cervical region: Secondary | ICD-10-CM | POA: Diagnosis not present

## 2018-11-12 DIAGNOSIS — M9905 Segmental and somatic dysfunction of pelvic region: Secondary | ICD-10-CM | POA: Diagnosis not present

## 2018-11-12 DIAGNOSIS — M9902 Segmental and somatic dysfunction of thoracic region: Secondary | ICD-10-CM | POA: Diagnosis not present

## 2018-11-20 DIAGNOSIS — M9902 Segmental and somatic dysfunction of thoracic region: Secondary | ICD-10-CM | POA: Diagnosis not present

## 2018-11-20 DIAGNOSIS — M9905 Segmental and somatic dysfunction of pelvic region: Secondary | ICD-10-CM | POA: Diagnosis not present

## 2018-11-20 DIAGNOSIS — M9901 Segmental and somatic dysfunction of cervical region: Secondary | ICD-10-CM | POA: Diagnosis not present

## 2018-11-20 DIAGNOSIS — M9903 Segmental and somatic dysfunction of lumbar region: Secondary | ICD-10-CM | POA: Diagnosis not present

## 2018-12-04 ENCOUNTER — Other Ambulatory Visit: Payer: Self-pay | Admitting: Emergency Medicine

## 2019-01-15 DIAGNOSIS — M9901 Segmental and somatic dysfunction of cervical region: Secondary | ICD-10-CM | POA: Diagnosis not present

## 2019-01-15 DIAGNOSIS — M9903 Segmental and somatic dysfunction of lumbar region: Secondary | ICD-10-CM | POA: Diagnosis not present

## 2019-01-15 DIAGNOSIS — M9902 Segmental and somatic dysfunction of thoracic region: Secondary | ICD-10-CM | POA: Diagnosis not present

## 2019-01-15 DIAGNOSIS — M9905 Segmental and somatic dysfunction of pelvic region: Secondary | ICD-10-CM | POA: Diagnosis not present

## 2019-01-28 DIAGNOSIS — M9902 Segmental and somatic dysfunction of thoracic region: Secondary | ICD-10-CM | POA: Diagnosis not present

## 2019-01-28 DIAGNOSIS — M9901 Segmental and somatic dysfunction of cervical region: Secondary | ICD-10-CM | POA: Diagnosis not present

## 2019-01-28 DIAGNOSIS — M9905 Segmental and somatic dysfunction of pelvic region: Secondary | ICD-10-CM | POA: Diagnosis not present

## 2019-01-28 DIAGNOSIS — M9903 Segmental and somatic dysfunction of lumbar region: Secondary | ICD-10-CM | POA: Diagnosis not present

## 2019-06-03 DIAGNOSIS — Z6832 Body mass index (BMI) 32.0-32.9, adult: Secondary | ICD-10-CM | POA: Diagnosis not present

## 2019-06-03 DIAGNOSIS — Z0000 Encounter for general adult medical examination without abnormal findings: Secondary | ICD-10-CM | POA: Diagnosis not present

## 2019-06-03 DIAGNOSIS — G43709 Chronic migraine without aura, not intractable, without status migrainosus: Secondary | ICD-10-CM | POA: Diagnosis not present

## 2019-06-03 DIAGNOSIS — E063 Autoimmune thyroiditis: Secondary | ICD-10-CM | POA: Diagnosis not present

## 2019-06-03 DIAGNOSIS — Z23 Encounter for immunization: Secondary | ICD-10-CM | POA: Diagnosis not present

## 2019-06-03 DIAGNOSIS — Z1389 Encounter for screening for other disorder: Secondary | ICD-10-CM | POA: Diagnosis not present

## 2019-06-04 DIAGNOSIS — S82899A Other fracture of unspecified lower leg, initial encounter for closed fracture: Secondary | ICD-10-CM

## 2019-06-04 DIAGNOSIS — S62609A Fracture of unspecified phalanx of unspecified finger, initial encounter for closed fracture: Secondary | ICD-10-CM

## 2019-06-04 HISTORY — DX: Fracture of unspecified phalanx of unspecified finger, initial encounter for closed fracture: S62.609A

## 2019-06-04 HISTORY — DX: Other fracture of unspecified lower leg, initial encounter for closed fracture: S82.899A

## 2019-06-08 ENCOUNTER — Other Ambulatory Visit: Payer: Self-pay | Admitting: Physician Assistant

## 2019-06-08 DIAGNOSIS — Z1231 Encounter for screening mammogram for malignant neoplasm of breast: Secondary | ICD-10-CM

## 2019-06-27 DIAGNOSIS — S62647A Nondisplaced fracture of proximal phalanx of left little finger, initial encounter for closed fracture: Secondary | ICD-10-CM | POA: Diagnosis not present

## 2019-07-03 ENCOUNTER — Emergency Department (HOSPITAL_COMMUNITY): Payer: BC Managed Care – PPO

## 2019-07-03 ENCOUNTER — Emergency Department (HOSPITAL_COMMUNITY)
Admission: EM | Admit: 2019-07-03 | Discharge: 2019-07-03 | Disposition: A | Discharge status: Home / Self Care | Payer: BC Managed Care – PPO | Attending: Emergency Medicine | Admitting: Emergency Medicine

## 2019-07-03 DIAGNOSIS — F17210 Nicotine dependence, cigarettes, uncomplicated: Secondary | ICD-10-CM | POA: Insufficient documentation

## 2019-07-03 DIAGNOSIS — Y929 Unspecified place or not applicable: Secondary | ICD-10-CM | POA: Diagnosis not present

## 2019-07-03 DIAGNOSIS — Y999 Unspecified external cause status: Secondary | ICD-10-CM | POA: Insufficient documentation

## 2019-07-03 DIAGNOSIS — S8264XA Nondisplaced fracture of lateral malleolus of right fibula, initial encounter for closed fracture: Secondary | ICD-10-CM | POA: Diagnosis not present

## 2019-07-03 DIAGNOSIS — Z79899 Other long term (current) drug therapy: Secondary | ICD-10-CM | POA: Diagnosis not present

## 2019-07-03 DIAGNOSIS — R9431 Abnormal electrocardiogram [ECG] [EKG]: Secondary | ICD-10-CM | POA: Diagnosis not present

## 2019-07-03 DIAGNOSIS — Y9301 Activity, walking, marching and hiking: Secondary | ICD-10-CM | POA: Insufficient documentation

## 2019-07-03 DIAGNOSIS — X501XXA Overexertion from prolonged static or awkward postures, initial encounter: Secondary | ICD-10-CM | POA: Insufficient documentation

## 2019-07-03 DIAGNOSIS — S99922A Unspecified injury of left foot, initial encounter: Secondary | ICD-10-CM | POA: Diagnosis not present

## 2019-07-03 DIAGNOSIS — S82891A Other fracture of right lower leg, initial encounter for closed fracture: Secondary | ICD-10-CM

## 2019-07-03 DIAGNOSIS — K219 Gastro-esophageal reflux disease without esophagitis: Secondary | ICD-10-CM | POA: Insufficient documentation

## 2019-07-03 DIAGNOSIS — S8261XA Displaced fracture of lateral malleolus of right fibula, initial encounter for closed fracture: Secondary | ICD-10-CM | POA: Diagnosis not present

## 2019-07-03 MED ORDER — FAMOTIDINE 10 MG PO TABS: 10.0000 mg | ORAL_TABLET | Freq: Two times a day (BID) | ORAL | 0 refills | Status: DC

## 2019-07-03 MED ORDER — ALUM & MAG HYDROXIDE-SIMETH 200-200-20 MG/5ML PO SUSP
30.0000 mL | Freq: Once | ORAL | Status: AC
  Administered 2019-07-03: 30 mL via ORAL
  Filled 2019-07-03: qty 30

## 2019-07-03 MED ORDER — FENTANYL CITRATE (PF) 100 MCG/2ML IJ SOLN
50.0000 ug | Freq: Once | INTRAMUSCULAR | Status: AC
  Administered 2019-07-03: 10:00:00 50 ug via INTRAVENOUS
  Filled 2019-07-03: qty 2

## 2019-07-03 MED ORDER — LIDOCAINE VISCOUS HCL 2 % MT SOLN
15.0000 mL | Freq: Once | OROMUCOSAL | Status: AC
  Administered 2019-07-03: 13:00:00 15 mL via ORAL
  Filled 2019-07-03: qty 15

## 2019-07-03 MED ORDER — HYDROMORPHONE HCL 1 MG/ML IJ SOLN: 1.0000 mg | Freq: Once | INTRAMUSCULAR | Status: DC

## 2019-07-03 MED ORDER — HYDROCODONE-ACETAMINOPHEN 5-325 MG PO TABS: 1.0000 | ORAL_TABLET | Freq: Four times a day (QID) | ORAL | 0 refills | Status: DC | PRN

## 2019-07-03 MED ORDER — HYDROMORPHONE HCL 1 MG/ML IJ SOLN
0.5000 mg | Freq: Once | INTRAMUSCULAR | Status: AC
  Administered 2019-07-03: 0.5 mg via INTRAVENOUS
  Filled 2019-07-03: qty 1

## 2019-07-03 NOTE — ED Triage Notes (Signed)
Pt fell this AM while power was out and was walking down steps, missed the last step and rolled her ankle, felt a pop. Endorses "waves" of pain, cannot bear weight, bruising and swelling present.

## 2019-07-03 NOTE — ED Notes (Signed)
Pt reports epigastric burning, appears sweaty and agitated, EKG performed, PA and MD notified. Pt state sthat she feels this is GERD related, just drank coffee. Milk provided to pt.

## 2019-07-03 NOTE — Progress Notes (Signed)
Orthopedic Tech Progress Note Patient Details:  Mary Spencer 03/09/1966 [id number]  Ortho Devices Type of Ortho Device: CAM walker, Finger splint, Crutches Ortho Device/Splint Location: ULE, LRE Ortho Device/Splint Interventions: Adjustment, Application, Ordered   Post Interventions Patient Tolerated: Well Instructions Provided: Care of device, Adjustment of device   Janit Pagan 07/03/2019, 12:00 PM

## 2019-07-03 NOTE — ED Notes (Signed)
Pt now endorsing chest pain and clenching chest. RN at bedside doing EKG. PA made aware.

## 2019-07-03 NOTE — ED Notes (Signed)
Ortho paged. 
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
## 2019-07-03 NOTE — ED Notes (Signed)
PA at bedside.

## 2019-07-03 NOTE — ED Notes (Signed)
Patient transported to X-ray 

## 2019-07-03 NOTE — Discharge Instructions (Addendum)
You have been diagnosed today with avulsion fracture of the right lateral malleolus and GERD.  At this time there does not appear to be the presence of an emergent medical condition, however there is always the potential for conditions to change. Please read and follow the below instructions.  Please return to the Emergency Department immediately for any new or worsening symptoms. Please be sure to follow up with your Primary Care Provider within one week regarding your visit today; please call their office to schedule an appointment even if you are feeling better for a follow-up visit. Please call the orthopedic surgeon Dr. Marlou Sa on your discharge paperwork today to schedule a follow-up appointment regarding your avulsion fracture of your right lateral malleolus.  Please continue use the cam walker to protect your ankle from further injury and the crutches to help keep weight off of your ankle.  Please use rest, ice and elevation to help with your pain and swelling.  You may use the pain medication Norco as prescribed for severe pain.  Do not drive or perform dangerous activities while taking Norco as will make you drowsy.  Do not drink alcohol or take other sedating medications with Norco as this will worsen side effects. Please continue taking your Protonix as prescribed for your gastroesophageal reflux disease.  You may also begin taking the Pepcid prescribed to you today to help with your symptoms.  You may follow-up with the specialists at [facility] gastroenterology for further evaluation of heartburn, call their office today to schedule an appointment. Please continue wearing your finger splint to protect your left fifth finger from further injury and follow-up with the hand doctor as previously instructed from the urgent care. Additionally your x-ray today showed a mild hallux valgus deformity of your first MTP joint osteoarthritis, discuss this incidental finding with your orthopedist and primary care  provider at your next appointment.  Get help right away if: Your cast gets damaged. You continue to have very bad pain. You have new pain or swelling. Your skin or toes below the injured ankle: Turn blue or gray. Feel cold or numb. Lose sensitivity to touch. You have pain in your arms, neck, jaw, teeth, or back. You feel sweaty, dizzy, or light-headed. You have chest pain or shortness of breath. You throw up (vomit) and your throw-up looks like blood or coffee grounds. You pass out (faint). Your poop (stool) is bloody or black. You cannot swallow, drink, or eat. You have any new/concerning or worsening of symptoms  Please read the additional information packets attached to your discharge summary.  Do not take your medicine if  develop an itchy rash, swelling in your mouth or lips, or difficulty breathing; call 911 and seek immediate emergency medical attention if this occurs.  Note: Portions of this text may have been transcribed using voice recognition software. Every effort was made to ensure accuracy; however, inadvertent computerized transcription errors may still be present.

## 2019-07-08 ENCOUNTER — Ambulatory Visit: Payer: Self-pay

## 2019-07-08 ENCOUNTER — Encounter: Payer: Self-pay | Admitting: Orthopedic Surgery

## 2019-07-08 ENCOUNTER — Other Ambulatory Visit: Payer: Self-pay

## 2019-07-08 ENCOUNTER — Ambulatory Visit: Payer: BC Managed Care – PPO | Admitting: Orthopedic Surgery

## 2019-07-08 DIAGNOSIS — M25571 Pain in right ankle and joints of right foot: Secondary | ICD-10-CM

## 2019-07-08 DIAGNOSIS — M79642 Pain in left hand: Secondary | ICD-10-CM

## 2019-07-08 MED ORDER — TRAMADOL HCL 50 MG PO TABS: ORAL_TABLET | ORAL | 0 refills | Status: DC

## 2019-07-08 MED ORDER — ASPIRIN EC 81 MG PO TBEC: DELAYED_RELEASE_TABLET | ORAL | 0 refills | Status: DC

## 2019-07-08 NOTE — Progress Notes (Signed)
Office Visit Note   Patient: Mary Spencer           Date of Birth: Jan 25, 1966           MRN: [id number] Visit Date: 07/08/2019 Requested by: No referring provider defined for this encounter. PCP: Patient, No Pcp Per  Subjective: Chief Complaint  Patient presents with   Right Ankle - Pain   Left Hand - Pain    HPI: Mary Spencer is a patient injured her right ankle 07/03/2019.  She also injured her left hand 2 weeks ago and it was still painful.  Had an object fall across the dorsal aspect of the hand.  The right ankle was painful.  She fell while walking downstairs.  Went to the emergency department radiographs were obtained.  She has been ambulating in a fracture boot.  Radiographs there suggested possible small avulsion fracture off the fibular malleolus.  Been taking Tylenol and ibuprofen.  She is right-hand dominant.  The finger is getting better.  She is in a splint which actually immobilizes the MCP joint as well.              ROS: All systems reviewed are negative as they relate to the chief complaint within the history of present illness.  Patient denies  fevers or chills.   Assessment & Plan: Visit Diagnoses:  1. Pain in right ankle and joints of right foot   2. Pain in left hand     Plan: Impression is right ankle pain with ankle sprain and medial and lateral sided tenderness with some swelling and ecchymosis.  No acute fracture.  I would like her to stay in that fracture boot for 2 more weeks weightbearing as tolerated and we will see her back in 2 weeks and change her over to an ASO possibly consider therapy at that time.  No calf tenderness today.  Negative Homans' sign.  Also add tramadol to take at night and aspirin to take 81 mg a day just to keep her blood thin.  Gave her a note for the airport.  Regarding that small finger like her to come out of that splint which is likely making her lumbricals tight.  I think is okay to buddy tape digits 4 and 5 together and to work on  range of motion.  We will recheck that left hand clinically at the next visit as well.  Follow-Up Instructions: Return in about 2 weeks (around 07/22/2019).   Orders:  Orders Placed This Encounter  Procedures   XR Finger Little Left   XR Ankle Complete Right   Meds ordered this encounter  Medications   traMADol (ULTRAM) 50 MG tablet    Sig: 1 po q hs prn pain    Dispense:  30 tablet    Refill:  0   aspirin EC 81 MG tablet    Sig: 1 po q daily    Dispense:  60 tablet    Refill:  0      Procedures: No procedures performed   Clinical Data: No additional findings.  Objective: Vital Signs: There were no vitals taken for this visit.  Physical Exam:   Constitutional: Patient appears well-developed HEENT:  Head: Normocephalic Eyes:EOM are normal Neck: Normal range of motion Cardiovascular: Normal rate Pulmonary/chest: Effort normal Neurologic: Patient is alert Skin: Skin is warm Psychiatric: Patient has normal mood and affect    Ortho Exam: Ortho exam demonstrates full active and passive range of motion of the right knee.  The  right ankle has swelling and ecchymosis primarily laterally.  No tenderness in the tarsometatarsal region.  Does have medial malleolar tenderness as well as ATFL tenderness.  Patient has palpable intact nontender anterior to posterior to peroneal and Achilles tendons.  No other masses lymphadenopathy or skin changes noted in that right ankle region.  Small finger has tenderness to palpation at the MCP joint #5.  No real rotational deformity is present.  Flexion and extension of that small finger is intact.  Specialty Comments:  No specialty comments available.  Imaging: Xr Ankle Complete Right  Result Date: 07/08/2019 AP lateral mortise right ankle reviewed.  No acute fracture or dislocation is present.  Mortise is symmetric.  Xr Finger Little Left  Result Date: 07/08/2019 AP lateral oblique left small finger reviewed.  Nondisplaced  fracture at the base of the proximal phalanx is noted.  Not much in the way of callus formation is present.  Finger is located and alignment is intact    PMFS History: Patient Active Problem List   Diagnosis Date Noted   Chronic migraine without aura without status migrainosus, not intractable 10/01/2018   Intractable migraine with aura with status migrainosus 10/01/2018   Vestibular migraine 10/01/2018   Past Medical History:  Diagnosis Date   Migraines    Thyroid disease     Family History  Problem Relation Age of Onset   Stroke Mother    Seizures Mother    Cancer Mother    Macular degeneration Father    Migraines Sister        cluster   Stroke Maternal Grandmother    Seizures Maternal Grandmother    Cancer Maternal Grandfather    Migraines Sister        cluster    Past Surgical History:  Procedure Laterality Date   ABDOMINAL HYSTERECTOMY     HERNIA REPAIR     TONSILLECTOMY     Social History   Occupational History   Not on file  Tobacco Use   Smoking status: Current Every Day Smoker    Packs/day: 0.50    Types: Cigarettes   Smokeless tobacco: Never Used  Substance and Sexual Activity   Alcohol use: Yes    Comment: "very rarely, maybe 1 drink on a holiday"   Drug use: Never   Sexual activity: Not on file

## 2019-07-22 DIAGNOSIS — M9902 Segmental and somatic dysfunction of thoracic region: Secondary | ICD-10-CM | POA: Diagnosis not present

## 2019-07-22 DIAGNOSIS — M9905 Segmental and somatic dysfunction of pelvic region: Secondary | ICD-10-CM | POA: Diagnosis not present

## 2019-07-22 DIAGNOSIS — M9901 Segmental and somatic dysfunction of cervical region: Secondary | ICD-10-CM | POA: Diagnosis not present

## 2019-07-22 DIAGNOSIS — M9903 Segmental and somatic dysfunction of lumbar region: Secondary | ICD-10-CM | POA: Diagnosis not present

## 2019-07-23 ENCOUNTER — Other Ambulatory Visit: Payer: Self-pay

## 2019-07-23 ENCOUNTER — Ambulatory Visit: Payer: Self-pay

## 2019-07-23 ENCOUNTER — Ambulatory Visit: Payer: BC Managed Care – PPO | Admitting: Orthopedic Surgery

## 2019-07-23 ENCOUNTER — Encounter: Payer: Self-pay | Admitting: Orthopedic Surgery

## 2019-07-23 DIAGNOSIS — M25571 Pain in right ankle and joints of right foot: Secondary | ICD-10-CM

## 2019-07-25 ENCOUNTER — Encounter: Payer: Self-pay | Admitting: Orthopedic Surgery

## 2019-07-25 NOTE — Progress Notes (Signed)
Office Visit Note   Patient: Mary Spencer           Date of Birth: [birth year]           MRN: [id number] Visit Date: 07/23/2019 Requested by: No referring provider defined for this encounter. PCP: Patient, No Pcp Per  Subjective: Chief Complaint  Patient presents with   Left Hand - Follow-up   Right Ankle - Follow-up    HPI: Mary Spencer is a patient here for follow-up of her right ankle and left finger.  She did have left finger fracture at the base of the proximal phalanx.  That is improving.  She also has right ankle injury.  She has been in a fracture boot.  Still is having a lot of foot pain.  She is managing.              ROS: All systems reviewed are negative as they relate to the chief complaint within the history of present illness.  Patient denies  fevers or chills.   Assessment & Plan: Visit Diagnoses:  1. Pain in right ankle and joints of right foot     Plan: Impression is improvement in left small finger with good range of motion but not complete range of motion.  No rotational deformity is present.  MCP joint is stable.  That something that we can watch and continue to work on range of motion with.  Regarding the right ankle that is getting better.  I would like to transition her to a lace up ankle brace to be used for 3 weeks and then she will follow-up with me as needed.  I think this ankle injury should be self-limited.  Follow-Up Instructions: No follow-ups on file.   Orders:  Orders Placed This Encounter  Procedures   XR Foot Complete Right   XR Ankle Complete Right   No orders of the defined types were placed in this encounter.     Procedures: No procedures performed   Clinical Data: No additional findings.  Objective: Vital Signs: There were no vitals taken for this visit.  Physical Exam: Ortho exam on that left hand demonstrates improved range of motion of that fifth finger.  MCP joint is stable.  Extension is full but she is lacking about 1  cm of full flexion fingertips to hyperthenar eminence.  Regarding the right ankle she has mild swelling and tenderness around the ATFL and deltoid ligament.  Collaterals are stable.  Ankle dorsiflexion plantarflexion is intact.  Pedal pulses palpable  Ortho Exam:   Constitutional: Patient appears well-developed HEENT:  Head: Normocephalic Eyes:EOM are normal Neck: Normal range of motion Cardiovascular: Normal rate Pulmonary/chest: Effort normal Neurologic: Patient is alert Skin: Skin is warm Psychiatric: Patient has normal mood and affect    Specialty Comments:  No specialty comments available.  Imaging: No results found.   PMFS History: Patient Active Problem List   Diagnosis Date Noted   Chronic migraine without aura without status migrainosus, not intractable 10/01/2018   Intractable migraine with aura with status migrainosus 10/01/2018   Vestibular migraine 10/01/2018   Past Medical History:  Diagnosis Date   Migraines    Thyroid disease     Family History  Problem Relation Age of Onset   Stroke Mother    Seizures Mother    Cancer Mother    Macular degeneration Father    Migraines Sister        cluster   Stroke Maternal Grandmother    Seizures Maternal Grandmother  Cancer Maternal Grandfather    Migraines Sister        cluster    Past Surgical History:  Procedure Laterality Date   ABDOMINAL HYSTERECTOMY     HERNIA REPAIR     TONSILLECTOMY     Social History   Occupational History   Not on file  Tobacco Use   Smoking status: Current Every Day Smoker    Packs/day: 0.50    Types: Cigarettes   Smokeless tobacco: Never Used  Substance and Sexual Activity   Alcohol use: Yes    Comment: "very rarely, maybe 1 drink on a holiday"   Drug use: Never   Sexual activity: Not on file

## 2019-07-29 DIAGNOSIS — M9901 Segmental and somatic dysfunction of cervical region: Secondary | ICD-10-CM | POA: Diagnosis not present

## 2019-07-29 DIAGNOSIS — M9902 Segmental and somatic dysfunction of thoracic region: Secondary | ICD-10-CM | POA: Diagnosis not present

## 2019-07-29 DIAGNOSIS — M9903 Segmental and somatic dysfunction of lumbar region: Secondary | ICD-10-CM | POA: Diagnosis not present

## 2019-07-29 DIAGNOSIS — M9905 Segmental and somatic dysfunction of pelvic region: Secondary | ICD-10-CM | POA: Diagnosis not present

## 2019-08-12 DIAGNOSIS — M9901 Segmental and somatic dysfunction of cervical region: Secondary | ICD-10-CM | POA: Diagnosis not present

## 2019-08-12 DIAGNOSIS — M9903 Segmental and somatic dysfunction of lumbar region: Secondary | ICD-10-CM | POA: Diagnosis not present

## 2019-08-12 DIAGNOSIS — M9902 Segmental and somatic dysfunction of thoracic region: Secondary | ICD-10-CM | POA: Diagnosis not present

## 2019-08-12 DIAGNOSIS — M9905 Segmental and somatic dysfunction of pelvic region: Secondary | ICD-10-CM | POA: Diagnosis not present

## 2019-09-21 DIAGNOSIS — Z1389 Encounter for screening for other disorder: Secondary | ICD-10-CM | POA: Diagnosis not present

## 2019-09-21 DIAGNOSIS — E6609 Other obesity due to excess calories: Secondary | ICD-10-CM | POA: Diagnosis not present

## 2019-09-21 DIAGNOSIS — H6090 Unspecified otitis externa, unspecified ear: Secondary | ICD-10-CM | POA: Diagnosis not present

## 2019-09-21 DIAGNOSIS — L739 Follicular disorder, unspecified: Secondary | ICD-10-CM | POA: Diagnosis not present

## 2019-09-21 DIAGNOSIS — Z6830 Body mass index (BMI) 30.0-30.9, adult: Secondary | ICD-10-CM | POA: Diagnosis not present

## 2019-10-07 ENCOUNTER — Ambulatory Visit: Payer: BLUE CROSS/BLUE SHIELD | Admitting: Neurology

## 2019-10-29 ENCOUNTER — Other Ambulatory Visit: Payer: Self-pay

## 2019-10-29 ENCOUNTER — Ambulatory Visit
Admission: RE | Admit: 2019-10-29 | Discharge: 2019-10-29 | Disposition: A | Discharge status: Home / Self Care | Payer: BC Managed Care – PPO | Source: Ambulatory Visit | Attending: Physician Assistant | Admitting: Physician Assistant

## 2019-10-29 DIAGNOSIS — Z1231 Encounter for screening mammogram for malignant neoplasm of breast: Secondary | ICD-10-CM | POA: Diagnosis not present

## 2019-11-06 ENCOUNTER — Other Ambulatory Visit: Payer: Self-pay | Admitting: Neurology

## 2019-11-06 DIAGNOSIS — G43709 Chronic migraine without aura, not intractable, without status migrainosus: Secondary | ICD-10-CM

## 2019-11-15 NOTE — Progress Notes (Signed)
[facility]    Provider:  Dr Lucia GaskinsAhern Referring Provider: No ref. provider found Primary Care Physician:  Georgina QuintSagardia, Miguel Jose, *  CC:  Migraines  Interval history: Could not get the Aimovig. We will try Ajovy, gave her 3 months samples in the meantime we will try and get it approved. She is stable, she has a lot of dizziness and vestibular symptomsshe is having more headaches, more stress at work, daily vertigo and dizziness, daily headaches and symptoms even if mild, tylenol helps with motrin and that usually helps, she has maxalt and that helps, she has to take maxalt 8 days a month for migraines.   HPI:  Mary Spencer is a 54 y.o. female here as requested by Dr. No ref. provider found for chronic migraines.  Past medical history of chronic migraines presenting with dizzy spells and vertigo, chronic anxiety.  She has been managed by neurology in the past for her migraines.  She also has a history of thyroid disease.She has been to ENT multiple times and several of them, she has had extensive cardiac workup. 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 She can have headaches. Maxalt works well. Headaches are pulsating and pounding, light and sound sensitivity and nausea and vomiting.  She has daily vertigo and dizziness, it is just normal to her and she will set her hand on something and sits a lot, She has headaches which are minor 12 days a month and her main symptoms daily are vestibular. Nerve blocks helped in the past every 3 months occipital nerve blocks. She ses a [facility]. She had a neurologist in [place]. She sees Dr. Lois Huxleyidufo chiropractor. Migraines can last 24-72 hours. She is on Topiramate and Alavil. No new symptoms or focal deficits, no  hearing changes, no vision changes. She had had extended eeg, multiple MRIs. No other focal neurologic deficits, associated symptoms, inciting events or modifiable factors.  Meds tried: Elavil, Topiramate  Reviewed notes, labs and imaging from outside physicians, which showed:  Reviewed emergency room notes patient was seen in September 2019.  Patient moved to the area several months prior and had not been able to establish with a PCP.  She is followed with a neurologist for chronic migraine in the past which presented as dizziness and vertigo type symptoms.  Also stated she gets injections to the base of her head which she has not been getting.  No changes in her headaches currently.  She is on amitriptyline, topiramate and BuSpar.  Vitals, labs and exam were normal in the emergency room.  Patient was seen at Dr. Latrelle DodrillSagardia's office to establish care in October 2019.  She recently moved to this area from [place].  Has a history of chronic migraines presenting with dizzy spells and vertigo.  Also has a history of chronic anxiety.  Has seen neurology in the past.  Stated she had dizziness and headaches infrequently.  Physical exam and neurologic exam was normal.  No changes were made to her medications.  Will request notes from prior neurologist's office  Review of Systems: Patient complains of symptoms per HPI as well as the following symptoms: headache, dizziness. Pertinent negatives and positives per HPI. All others negative.   Social History   Socioeconomic History  . Marital status: Married  Spouse name: Not on file  . Number of children: 2  . Years of education: Not on file  . Highest education level: Bachelor's degree (e.g., BA, AB, BS)  Occupational History  . Not on file  Tobacco Use  . Smoking status: Former Smoker    Packs/day: 0.50    Types: Cigarettes    Quit date: 11/2018    Years since quitting: 1.0  . Smokeless tobacco: Never Used  Substance and Sexual Activity  .  Alcohol use: Yes    Comment: "very rarely, maybe 1 drink on a holiday"  . Drug use: Never  . Sexual activity: Not on file  Other Topics Concern  . Not on file  Social History Narrative   Lives at home with her husband    Right handed   Caffeine: 2-3 cups daily   Social Determinants of Health   Financial Resource Strain:   . Difficulty of Paying Living Expenses:   Food Insecurity:   . Worried About Programme researcher, broadcasting/film/video in the Last Year:   . Barista in the Last Year:   Transportation Needs:   . Freight forwarder (Medical):   Marland Kitchen Lack of Transportation (Non-Medical):   Physical Activity:   . Days of Exercise per Week:   . Minutes of Exercise per Session:   Stress:   . Feeling of Stress :   Social Connections:   . Frequency of Communication with Friends and Family:   . Frequency of Social Gatherings with Friends and Family:   . Attends Religious Services:   . Active Member of Clubs or Organizations:   . Attends Banker Meetings:   Marland Kitchen Marital Status:   Intimate Partner Violence:   . Fear of Current or Ex-Partner:   . Emotionally Abused:   Marland Kitchen Physically Abused:   . Sexually Abused:     Family History  Problem Relation Age of Onset  . Stroke Mother   . Seizures Mother   . Cancer Mother   . Macular degeneration Father   . Migraines Sister        cluster  . Stroke Maternal Grandmother   . Seizures Maternal Grandmother   . Cancer Maternal Grandfather   . Migraines Sister        cluster    Past Medical History:  Diagnosis Date  . Ankle fracture 06/2019   right   . Finger fracture 06/2019   Right hand, 5th finger  . Migraines   . Thyroid disease     Past Surgical History:  Procedure Laterality Date  . ABDOMINAL HYSTERECTOMY    . HERNIA REPAIR    . TONSILLECTOMY      Current Outpatient Medications  Medication Sig Dispense Refill  . amitriptyline (ELAVIL) 75 MG tablet Take 1 tablet (75 mg total) by mouth at bedtime. 90 tablet 3  .  aspirin EC 81 MG tablet 1 po q daily 60 tablet 0  . busPIRone (BUSPAR) 10 MG tablet Take 10 mg by mouth 2 (two) times daily.    . famotidine (PEPCID) 10 MG tablet Take 1 tablet (10 mg total) by mouth 2 (two) times daily. 30 tablet 0  . HYDROcodone-acetaminophen (NORCO/VICODIN) 5-325 MG tablet Take 1-2 tablets by mouth every 6 (six) hours as needed. 8 tablet 0  . promethazine (PHENERGAN) 12.5 MG tablet Take 1 tablet (12.5 mg total) by mouth every 6 (six) hours as needed for nausea or vomiting. May take with Maxalt (Rizatriptan). 90 tablet 4  .  rizatriptan (MAXALT-MLT) 10 MG disintegrating tablet Take 1 tablet (10 mg total) by mouth as needed for migraine. May repeat in 2 hours if needed 27 tablet 4  . traMADol (ULTRAM) 50 MG tablet 1 po q hs prn pain 30 tablet 0  . Fremanezumab-vfrm (AJOVY) 225 MG/1.5ML SOAJ Inject 225 mg into the skin every 30 (thirty) days. 1 pen 11  . Fremanezumab-vfrm (AJOVY) 225 MG/1.5ML SOAJ Inject 225 mg into the skin every 30 (thirty) days. 3 pen 0  . topiramate (TOPAMAX) 100 MG tablet Take 1 tablet (100 mg total) by mouth at bedtime. 90 tablet 4   No current facility-administered medications for this visit.    Allergies as of 11/16/2019 - Review Complete 11/16/2019  Allergen Reaction Noted  . Penicillins Anaphylaxis 06/02/2018  . Erythromycin Hives 06/02/2018  . Keflex [cephalexin] Hives 06/02/2018  . Sulfa antibiotics Hives 06/02/2018    Vitals: BP 106/76 (BP Location: Right Arm, Patient Position: Sitting)   Pulse 84   Temp (!) 97.2 F (36.2 C) Comment: taken at front  Ht 5\' 6"  (1.676 m)   Wt 197 lb (89.4 kg)   BMI 31.80 kg/m  Last Weight:  Wt Readings from Last 1 Encounters:  11/16/19 197 lb (89.4 kg)   Last Height:   Ht Readings from Last 1 Encounters:  11/16/19 5\' 6"  (1.676 m)    Physical exam: Exam: Gen: NAD, conversant, well nourised, obese, well groomed                     CV: RRR, no MRG. No Carotid Bruits. No peripheral edema, warm,  nontender Eyes: Conjunctivae clear without exudates or hemorrhage  Neuro: Detailed Neurologic Exam  Speech:    Speech is normal; fluent and spontaneous with normal comprehension.  Cognition:    The patient is oriented to person, place, and time;     recent and remote memory intact;     language fluent;     normal attention, concentration,     fund of knowledge Cranial Nerves:    The pupils are equal, round, and reactive to light. The fundi are normal and spontaneous venous pulsations are present. Visual fields are full to finger confrontation. Extraocular movements are intact. Trigeminal sensation is intact and the muscles of mastication are normal. The face is symmetric. The palate elevates in the midline. Hearing intact. Voice is normal. Shoulder shrug is normal. The tongue has normal motion without fasciculations.   Coordination:    Normal finger to nose and heel to shin. Normal rapid alternating movements.   Gait:    Heel-toe and tandem gait are normal.   Motor Observation:    No asymmetry, no atrophy, and no involuntary movements noted. Tone:    Normal muscle tone.    Posture:    Posture is normal. normal erect    Strength:    Strength is V/V in the upper and lower limbs.      Sensation: intact to LT     Reflex Exam:  DTR's:    Deep tendon reflexes in the upper and lower extremities are normal bilaterally.   Toes:    The toes are downgoing bilaterally.   Clonus:    Clonus is absent.    Assessment/Plan:  Patient with chronic migraines and associated dizziness or "vestibular migraines" as it is coined. She has daily symptoms. Will try Ajovy , also discussed botox. There is no data these work in primarily vestibular-type migraines however it is worth a try.  continue current medications: Elavil, Topiramate Acutely: Maxalt and phenergan Start Ajovy, gave 3 samples and co-pay card  Discussed: There is increased risk for stroke in women with migraine with aura and  a  Contraindication for the combined contraceptive pill for use by women who have migraine with aura, which is in line with World Health Organisation recommendations. The risk for women with migraine without aura is lower and other risk factors like smoking are far more likely to increase stroke risk than migraine. There is a recommendation for no smoking and for the use of low estrogen or progestogen only pills particularly for women with migraine with aura. It is important however that women with migraine who are taking the pill do not decide to suddenly stop taking it without discussing this with their doctor. Please discuss with her OB/GYN.  Discussed: To prevent or relieve headaches, try the following: Cool Compress. Lie down and place a cool compress on your head.  Avoid headache triggers. If certain foods or odors seem to have triggered your migraines in the past, avoid them. A headache diary might help you identify triggers.  Include physical activity in your daily routine. Try a daily walk or other moderate aerobic exercise.  Manage stress. Find healthy ways to cope with the stressors, such as delegating tasks on your to-do list.  Practice relaxation techniques. Try deep breathing, yoga, massage and visualization.  Eat regularly. Eating regularly scheduled meals and maintaining a healthy diet might help prevent headaches. Also, drink plenty of fluids.  Follow a regular sleep schedule. Sleep deprivation might contribute to headaches Consider biofeedback. With this mind-body technique, you learn to control certain bodily functions -- such as muscle tension, heart rate and blood pressure -- to prevent headaches or reduce headache pain.    Proceed to emergency room if you experience new or worsening symptoms or symptoms do not resolve, if you have new neurologic symptoms or if headache is severe, or for any concerning symptom.   Provided education and documentation from American headache Society  toolbox including articles on: chronic migraine medication overuse headache, chronic migraines, prevention of migraines, behavioral and other nonpharmacologic treatments for headache.  Meds ordered this encounter  Medications  . rizatriptan (MAXALT-MLT) 10 MG disintegrating tablet    Sig: Take 1 tablet (10 mg total) by mouth as needed for migraine. May repeat in 2 hours if needed    Dispense:  27 tablet    Refill:  4    This is a 90-day supply do not fill early  . topiramate (TOPAMAX) 100 MG tablet    Sig: Take 1 tablet (100 mg total) by mouth at bedtime.    Dispense:  90 tablet    Refill:  4  . promethazine (PHENERGAN) 12.5 MG tablet    Sig: Take 1 tablet (12.5 mg total) by mouth every 6 (six) hours as needed for nausea or vomiting. May take with Maxalt (Rizatriptan).    Dispense:  90 tablet    Refill:  4  . amitriptyline (ELAVIL) 75 MG tablet    Sig: Take 1 tablet (75 mg total) by mouth at bedtime.    Dispense:  90 tablet    Refill:  3  . DISCONTD: Fremanezumab-vfrm (AJOVY) 225 MG/1.5ML SOAJ    Sig: Inject 225 mg into the skin every 30 (thirty) days.    Dispense:  1 pen    Refill:  11  . Fremanezumab-vfrm (AJOVY) 225 MG/1.5ML SOAJ    Sig: Inject 225 mg into the skin  every 30 (thirty) days.    Dispense:  1 pen    Refill:  11  . Fremanezumab-vfrm (AJOVY) 225 MG/1.5ML SOAJ    Sig: Inject 225 mg into the skin every 30 (thirty) days.    Dispense:  3 pen    Refill:  0   I spent 25 minutes of face-to-face and non-face-to-face time with patient on the  1. Chronic migraine without aura without status migrainosus, not intractable   2. Vestibular migraine    diagnosis.  This included previsit chart review, lab review, study review, order entry, electronic health record documentation, patient education on the different diagnostic and therapeutic options, counseling and coordination of care, risks and benefits of management, compliance, or risk factor reduction   Sarina Ill,  MD  [facility] [address] [place], [place] [address]  Phone [phone] Fax [phone]

## 2019-11-16 ENCOUNTER — Ambulatory Visit: Payer: BC Managed Care – PPO | Admitting: Neurology

## 2019-11-16 ENCOUNTER — Other Ambulatory Visit: Payer: Self-pay

## 2019-11-16 ENCOUNTER — Encounter: Payer: Self-pay | Admitting: Neurology

## 2019-11-16 VITALS — BP 106/76 | HR 84 | Temp 97.2°F | Ht 66.0 in | Wt 197.0 lb

## 2019-11-16 DIAGNOSIS — G43809 Other migraine, not intractable, without status migrainosus: Secondary | ICD-10-CM

## 2019-11-16 DIAGNOSIS — G43709 Chronic migraine without aura, not intractable, without status migrainosus: Secondary | ICD-10-CM | POA: Diagnosis not present

## 2019-11-16 MED ORDER — AJOVY 225 MG/1.5ML ~~LOC~~ SOAJ: 225.0000 mg | SUBCUTANEOUS | 0 refills | Status: DC

## 2019-11-16 MED ORDER — AMITRIPTYLINE HCL 75 MG PO TABS: 75.0000 mg | ORAL_TABLET | Freq: Every day | ORAL | 3 refills | Status: DC

## 2019-11-16 MED ORDER — RIZATRIPTAN BENZOATE 10 MG PO TBDP: 10.0000 mg | ORAL_TABLET | ORAL | 4 refills | Status: DC | PRN

## 2019-11-16 MED ORDER — AJOVY 225 MG/1.5ML ~~LOC~~ SOAJ: 225.0000 mg | SUBCUTANEOUS | 11 refills | Status: DC

## 2019-11-16 MED ORDER — TOPIRAMATE 100 MG PO TABS: 100.0000 mg | ORAL_TABLET | Freq: Every day | ORAL | 4 refills | Status: DC

## 2019-11-16 MED ORDER — PROMETHAZINE HCL 12.5 MG PO TABS: 12.5000 mg | ORAL_TABLET | Freq: Four times a day (QID) | ORAL | 4 refills | Status: DC | PRN

## 2019-11-16 NOTE — Patient Instructions (Signed)
Start Ajovy  Fremanezumab injection What is this medicine? FREMANEZUMAB (fre ma NEZ ue mab) is used to prevent migraine headaches. This medicine may be used for other purposes; ask your health care provider or pharmacist if you have questions. COMMON BRAND NAME(S): AJOVY What should I tell my health care provider before I take this medicine? They need to know if you have any of these conditions:  an unusual or allergic reaction to fremanezumab, other medicines, foods, dyes, or preservatives  pregnant or trying to get pregnant  breast-feeding How should I use this medicine? This medicine is for injection under the skin. You will be taught how to prepare and give this medicine. Use exactly as directed. Take your medicine at regular intervals. Do not take your medicine more often than directed. It is important that you put your used needles and syringes in a special sharps container. Do not put them in a trash can. If you do not have a sharps container, call your pharmacist or healthcare provider to get one. Talk to your pediatrician regarding the use of this medicine in children. Special care may be needed. Overdosage: If you think you have taken too much of this medicine contact a poison control center or emergency room at once. NOTE: This medicine is only for you. Do not share this medicine with others. What if I miss a dose? If you miss a dose, take it as soon as you can. If it is almost time for your next dose, take only that dose. Do not take double or extra doses. What may interact with this medicine? Interactions are not expected. This list may not describe all possible interactions. Give your health care provider a list of all the medicines, herbs, non-prescription drugs, or dietary supplements you use. Also tell them if you smoke, drink alcohol, or use illegal drugs. Some items may interact with your medicine. What should I watch for while using this medicine? Tell your doctor or  healthcare professional if your symptoms do not start to get better or if they get worse. What side effects may I notice from receiving this medicine? Side effects that you should report to your doctor or health care professional as soon as possible:  allergic reactions like skin rash, itching or hives, swelling of the face, lips, or tongue Side effects that usually do not require medical attention (report these to your doctor or health care professional if they continue or are bothersome):  pain, redness, or irritation at site where injected This list may not describe all possible side effects. Call your doctor for medical advice about side effects. You may report side effects to FDA at 1-800-FDA-1088. Where should I keep my medicine? Keep out of the reach of children. You will be instructed on how to store this medicine. Throw away any unused medicine after the expiration date on the label. NOTE: This sheet is a summary. It may not cover all possible information. If you have questions about this medicine, talk to your doctor, pharmacist, or health care provider.  2020 Elsevier/Gold Standard (2017-05-20 17:22:56)    

## 2019-11-18 ENCOUNTER — Encounter: Payer: Self-pay | Admitting: *Deleted

## 2019-11-18 ENCOUNTER — Telehealth: Payer: Self-pay | Admitting: *Deleted

## 2019-11-18 NOTE — Telephone Encounter (Signed)
We received approval notice from [facility]. Ajovy approved from 10/19/19-11/17/20. I messaged pt to let her know and I faxed her pharmacy the approval notice. Received a receipt of confirmation.

## 2019-11-18 NOTE — Telephone Encounter (Signed)
Received Ajovy PA via fax from [facility]. PA completed, signed, and faxed back to [facility] along with recent office note. Received a receipt of confirmation.

## 2019-11-26 ENCOUNTER — Other Ambulatory Visit: Payer: Self-pay | Admitting: Physician Assistant

## 2019-11-26 DIAGNOSIS — Z6833 Body mass index (BMI) 33.0-33.9, adult: Secondary | ICD-10-CM | POA: Diagnosis not present

## 2019-11-26 DIAGNOSIS — E6609 Other obesity due to excess calories: Secondary | ICD-10-CM | POA: Diagnosis not present

## 2019-11-26 DIAGNOSIS — R1013 Epigastric pain: Secondary | ICD-10-CM

## 2019-11-26 DIAGNOSIS — Z23 Encounter for immunization: Secondary | ICD-10-CM | POA: Diagnosis not present

## 2019-11-30 ENCOUNTER — Ambulatory Visit (HOSPITAL_COMMUNITY)
Admission: RE | Admit: 2019-11-30 | Discharge: 2019-11-30 | Disposition: A | Discharge status: Home / Self Care | Payer: BC Managed Care – PPO | Source: Ambulatory Visit | Attending: Physician Assistant | Admitting: Physician Assistant

## 2019-11-30 ENCOUNTER — Other Ambulatory Visit: Payer: Self-pay

## 2019-11-30 DIAGNOSIS — R1013 Epigastric pain: Secondary | ICD-10-CM | POA: Insufficient documentation

## 2019-11-30 DIAGNOSIS — K8020 Calculus of gallbladder without cholecystitis without obstruction: Secondary | ICD-10-CM | POA: Diagnosis not present

## 2019-11-30 DIAGNOSIS — K760 Fatty (change of) liver, not elsewhere classified: Secondary | ICD-10-CM | POA: Diagnosis not present

## 2019-12-28 DIAGNOSIS — K8020 Calculus of gallbladder without cholecystitis without obstruction: Secondary | ICD-10-CM | POA: Diagnosis not present

## 2019-12-31 NOTE — Patient Instructions (Addendum)
DUE TO COVID-19 ONLY ONE VISITOR IS ALLOWED TO COME WITH YOU AND STAY IN THE WAITING ROOM ONLY DURING PRE OP AND PROCEDURE DAY OF SURGERY. THE 1 VISITOR MAY VISIT WITH YOU AFTER SURGERY IN YOUR PRIVATE ROOM DURING VISITING HOURS ONLY!  YOU NEED TO HAVE A COVID 19 TEST ON:01/05/20 @  12:00 , THIS TEST MUST BE DONE BEFORE SURGERY, COME  [address], [place] , [address].  [facility]) ONCE YOUR COVID TEST IS COMPLETED, PLEASE BEGIN THE QUARANTINE INSTRUCTIONS AS OUTLINED IN YOUR HANDOUT.                Mary Spencer    Your procedure is scheduled on: 01/08/20   Report to [facility] Main  Entrance   Report to admitting at: 10:30AM     Call this number if you have problems the morning of surgery [phone]    Remember: Do not eat solid food  :After Midnight. Clear liquids diet from midnight until 9:30 am.    CLEAR LIQUID DIET   Foods Allowed                                                                     Foods Excluded  Coffee and tea, regular and decaf                             liquids that you cannot  Plain Jell-O any favor except red or purple                                           see through such as: Fruit ices (not with fruit pulp)                                     milk, soups, orange juice  Iced Popsicles                                    All solid food Carbonated beverages, regular and diet                                    Cranberry, grape and apple juices Sports drinks like Gatorade Lightly seasoned clear broth or consume(fat free) Sugar, honey syrup  Sample Menu Breakfast                                Lunch                                     Supper Cranberry juice                    Beef broth  Chicken broth Jell-O                                     Grape juice                           Apple juice Coffee or tea                        Jell-O                                      Popsicle                                                 Coffee or tea                        Coffee or tea  _____________________________________________________________________    BRUSH YOUR TEETH MORNING OF SURGERY AND RINSE YOUR MOUTH OUT, NO CHEWING GUM CANDY OR MINTS.     Take these medicines the morning of surgery with A SIP OF WATER: buspirone,omeprazole.                                You may not have any metal on your body including hair pins and              piercings  Do not wear jewelry, make-up, lotions, powders or perfumes, deodorant             Do not wear nail polish on your fingernails.  Do not shave  48 hours prior to surgery.                Do not bring valuables to the hospital. [place].  Contacts, dentures or bridgework may not be worn into surgery.  Leave suitcase in the car. After surgery it may be brought to your room.     Patients discharged the day of surgery will not be allowed to drive home. IF YOU ARE HAVING SURGERY AND GOING HOME THE SAME DAY, YOU MUST HAVE AN ADULT TO DRIVE YOU HOME AND BE WITH YOU FOR 24 HOURS. YOU MAY GO HOME BY TAXI OR UBER OR ORTHERWISE, BUT AN ADULT MUST ACCOMPANY YOU HOME AND STAY WITH YOU FOR 24 HOURS.  Name and phone number of your driver:  Special Instructions: N/A              Please read over the following fact sheets you were given: _____________________________________________________________________             [facility] - Preparing for Surgery Before surgery, you can play an important role.  Because skin is not sterile, your skin needs to be as free of germs as possible.  You can reduce the number of germs on your skin by washing with CHG (chlorahexidine gluconate) soap before surgery.  CHG is an antiseptic cleaner which kills germs and bonds with the skin to  continue killing germs even after washing. Please DO NOT use if you have an allergy to CHG or antibacterial soaps.  If your  skin becomes reddened/irritated stop using the CHG and inform your nurse when you arrive at Short Stay. Do not shave (including legs and underarms) for at least 48 hours prior to the first CHG shower.  You may shave your face/neck. Please follow these instructions carefully:  1.  Shower with CHG Soap the night before surgery and the  morning of Surgery.  2.  If you choose to wash your hair, wash your hair first as usual with your  normal  shampoo.  3.  After you shampoo, rinse your hair and body thoroughly to remove the  shampoo.                           4.  Use CHG as you would any other liquid soap.  You can apply chg directly  to the skin and wash                       Gently with a scrungie or clean washcloth.  5.  Apply the CHG Soap to your body ONLY FROM THE NECK DOWN.   Do not use on face/ open                           Wound or open sores. Avoid contact with eyes, ears mouth and genitals (private parts).                       Wash face,  Genitals (private parts) with your normal soap.             6.  Wash thoroughly, paying special attention to the area where your surgery  will be performed.  7.  Thoroughly rinse your body with warm water from the neck down.  8.  DO NOT shower/wash with your normal soap after using and rinsing off  the CHG Soap.                9.  Pat yourself dry with a clean towel.            10.  Wear clean pajamas.            11.  Place clean sheets on your bed the night of your first shower and do not  sleep with pets. Day of Surgery : Do not apply any lotions/deodorants the morning of surgery.  Please wear clean clothes to the hospital/surgery center.  FAILURE TO FOLLOW THESE INSTRUCTIONS MAY RESULT IN THE CANCELLATION OF YOUR SURGERY PATIENT SIGNATURE_________________________________  NURSE SIGNATURE__________________________________  ________________________________________________________________________

## 2019-12-31 NOTE — Progress Notes (Signed)
Pt. Needs orders for upcoming surgery.PST appointment: 01/01/20

## 2020-01-01 ENCOUNTER — Other Ambulatory Visit: Payer: Self-pay

## 2020-01-01 ENCOUNTER — Ambulatory Visit: Payer: Self-pay | Admitting: Surgery

## 2020-01-01 ENCOUNTER — Encounter (HOSPITAL_COMMUNITY)
Admission: RE | Admit: 2020-01-01 | Discharge: 2020-01-01 | Disposition: A | Discharge status: Home / Self Care | Payer: BC Managed Care – PPO | Source: Ambulatory Visit | Attending: Surgery | Admitting: Surgery

## 2020-01-01 ENCOUNTER — Encounter (HOSPITAL_COMMUNITY): Payer: Self-pay

## 2020-01-01 HISTORY — DX: Pneumonia, unspecified organism: J18.9

## 2020-01-01 NOTE — Progress Notes (Signed)
PCP - Lenise Herald PAC. LOV: 11/16/19  Cardiologist -   Chest x-ray -  EKG - 07/06/19. Stress Test -  ECHO -  Cardiac Cath -   Sleep Study -  CPAP -   Fasting Blood Sugar -  Checks Blood Sugar _____ times a day  Blood Thinner Instructions: Aspirin Instructions: Last Dose:  Anesthesia review:   Patient denies shortness of breath, fever, cough and chest pain at PAT appointment   Patient verbalized understanding of instructions that were given to them at the PAT appointment. Patient was also instructed that they will need to review over the PAT instructions again at home before surgery.

## 2020-01-01 NOTE — H&P (View-Only) (Signed)
CC: Referred for possible symptomatic cholelithiasis  HPI: Mary Spencer is a very pleasant 54yoF with hx of migraines, thyroid disease who has a 6-8 month history of midepigastric abdominal pain. This is intermittent and associated with greasy/fatty foods. She describes the pain as being severe and a deep, bordering, achy sensation. This will typically last for 2-3 hours after eating increased female. She did have one episode a couple of weeks ago that was severe and lasted for 2 hole days before letting up. She denies fever/chills. She has associated nausea. The frequency of these varies but can be once a week on average. Pain does not radiate. She underwent abdominal ultrasound 11/30/19 which demonstrated innumerable tiny stones up to 6 cm in diameter in the gallbladder. There was no gallbladder wall thickening or pericholecystic fluid. CBD measured 5 mm. I don't have a copy of any recent lab work at this time per se.  PMH: migraines, MEG abdominal pain  PSH: Remote history of umbilical hernia repair through an umbilical incision-unclear if mesh use. She has more recently had a laparoscopic total hysterectomy with umbilical access. Tonsil surgery and right ankle surgery  FHx: Denies FHx of colorectal, breast, endometrial, ovarian or cervical cancer  Social: Denies use of tobacco/drugs; occasional social etoh use. Works in Producer, television/film/video for [employer]  ROS: A comprehensive 10 system review of systems was completed with the patient and pertinent findings as noted above.  The patient is a 54 year old female.   Past Surgical History (Chanel Lonni Fix, CMA; 12/28/2019 10:52 AM) Hysterectomy (not due to cancer) - Complete  Oral Surgery  Tonsillectomy   Diagnostic Studies History (Chanel Lonni Fix, CMA; 12/28/2019 10:52 AM) Colonoscopy  never Mammogram  within last year Pap Smear  >5 years ago  Allergies (Chanel Lonni Fix, CMA; 12/28/2019 10:54 AM) Penicillins  Erythromycin  *DERMATOLOGICALS*  Sulfa Antibiotics  Keflex *CEPHALOSPORINS*  Allergies Reconciled   Medication History (Chanel Lonni Fix, CMA; 12/28/2019 10:54 AM) Amitriptyline HCl (75MG  Tablet, Oral) Active. busPIRone HCl (10MG  Tablet, Oral) Active. Ajovy (225MG /1.5ML Soln Auto-inj, Subcutaneous) Active. Topiramate (100MG  Tablet, Oral) Active. Medications Reconciled  Social History , CMA; 12/28/2019 10:52 AM) Alcohol use  Occasional alcohol use. Caffeine use  Coffee. No drug use  Tobacco use  Former smoker.  Family History , CMA; 12/28/2019 10:52 AM) Arthritis  Father, Mother, Sister. Cancer  Mother. Cerebrovascular Accident  Mother. Migraine Headache  Father, Sister. Seizure disorder  Mother.  Pregnancy / Birth History Micheal Likens, CMA; 12/28/2019 10:52 AM) Age at menarche  10 years. Age of menopause  <44 Gravida  2 Length (months) of breastfeeding  3-6 Maternal age  67-25 Para  2  Other Problems (Chanel 12/30/2019, CMA; 12/28/2019 10:52 AM) Cholelithiasis  Gastroesophageal Reflux Disease  Migraine Headache  Oophorectomy  Bilateral. Thyroid Disease     Review of Systems (Chanel Nolan CMA; 12/28/2019 10:52 AM) General Not Present- Appetite Loss, Chills, Fatigue, Fever, Night Sweats, Weight Gain and Weight Loss. Skin Not Present- Change in Wart/Mole, Dryness, Hives, Jaundice, New Lesions, Non-Healing Wounds, Rash and Ulcer. HEENT Present- Wears glasses/contact lenses. Not Present- Earache, Hearing Loss, Hoarseness, Nose Bleed, Oral Ulcers, Ringing in the Ears, Seasonal Allergies, Sinus Pain, Sore Throat, Visual Disturbances and Yellow Eyes. Respiratory Not Present- Bloody sputum, Chronic Cough, Difficulty Breathing, Snoring and Wheezing. Breast Not Present- Breast Mass, Breast Pain, Nipple Discharge and Skin Changes. Cardiovascular Present- Swelling of Extremities. Not Present- Chest Pain, Difficulty Breathing Lying Down, Leg Cramps,  Palpitations, Rapid Heart Rate and Shortness of Breath. Gastrointestinal Present-  Indigestion. Not Present- Abdominal Pain, Bloating, Bloody Stool, Change in Bowel Habits, Chronic diarrhea, Constipation, Difficulty Swallowing, Excessive gas, Gets full quickly at meals, Hemorrhoids, Nausea, Rectal Pain and Vomiting. Female Genitourinary Present- Painful Urination. Not Present- Frequency, Nocturia, Pelvic Pain and Urgency. Musculoskeletal Present- Back Pain. Not Present- Joint Pain, Joint Stiffness, Muscle Pain, Muscle Weakness and Swelling of Extremities. Neurological Present- Headaches. Not Present- Decreased Memory, Fainting, Numbness, Seizures, Tingling, Tremor, Trouble walking and Weakness. Psychiatric Not Present- Anxiety, Bipolar, Change in Sleep Pattern, Depression, Fearful and Frequent crying. Endocrine Not Present- Cold Intolerance, Excessive Hunger, Hair Changes, Heat Intolerance, Hot flashes and New Diabetes. Hematology Not Present- Blood Thinners, Easy Bruising, Excessive bleeding, Gland problems, HIV and Persistent Infections.  Vitals (Chanel Nolan CMA; 12/28/2019 10:55 AM) 12/28/2019 10:54 AM Weight: 199.13 lb Height: 66in Body Surface Area: 2 m Body Mass Index: 32.14 kg/m  Temp.: 93.5F  Pulse: 95 (Regular)  BP: 120/70(Sitting, Left Arm, Standard)       Physical Exam (Marti Mclane M. Esparanza Krider MD; 12/28/2019 11:14 AM) The physical exam findings are as follows: Note: Constitutional: No acute distress; conversant; no deformities; wearing mask Eyes: Moist conjunctiva; no lid lag; anicteric sclerae; pupils equal and round Neck: Trachea midline; no palpable thyromegaly Lungs: Normal respiratory effort; no tactile fremitus CV: rrr; no palpable thrill; no pitting edema GI: Abdomen soft, nontender, nondistended; no palpable hepatosplenomegaly MSK: Normal gait; no clubbing/cyanosis Psychiatric: Appropriate affect; alert and oriented 3 Lymphatic: No palpable cervical or  axillary lymphadenopathy    Assessment & Plan (Victormanuel Mclure M. Jabar Krysiak MD; 12/28/2019 11:16 AM) SYMPTOMATIC CHOLELITHIASIS (K80.20) Story: Ms. Stan is a very pleasant 54yoF with hx of migraines - here for evaluation of what is most clinically consistent with symptomatic cholelithiasis Impression: -The anatomy and physiology of the hepatobiliary system was discussed at length with the patient and her husband with associated pictures. The pathophysiology of gallbladder disease was discussed at length with associated pictures as well -The options for treatment were discussed including ongoing observation which may result in subsequent gallbladder complications (infection, pancreatitis, choledocholithiasis, etc). We therefore reviewed cholecystectomy - laparoscopic and uncommon but potential open techniques -The planned procedure, material risks (including, but not limited to, pain, bleeding, infection, scarring, need for blood transfusion, damage to surrounding structures- blood vessels/nerves/viscus/organs, damage to bile duct, bile leak, need for additional procedures, hernia, pancreatitis, pneumonia, heart attack, stroke, death) benefits and alternatives to surgery were discussed at length. I noted a good probability that the procedure would help improve her symptoms. The patient's questions were answered to her satisfaction, she voiced understanding and they elected to proceed with surgery. Additionally, we discussed typical postoperative expectations and the recovery process.  This patient encounter took 34 minutes today to perform the following: take history, perform exam, review outside records, interpret imaging, counsel the patient on their diagnosis and document encounter, findings & plan in the EHR  Signed by Rahmir Beever M Elysha Daw, MD (12/28/2019 11:16 AM) 

## 2020-01-01 NOTE — H&P (Signed)
CC: Referred for possible symptomatic cholelithiasis  HPI: Mary Spencer is a very pleasant 54yoF with hx of migraines, thyroid disease who has a 6-8 month history of midepigastric abdominal pain. This is intermittent and associated with greasy/fatty foods. She describes the pain as being severe and a deep, bordering, achy sensation. This will typically last for 2-3 hours after eating increased female. She did have one episode a couple of weeks ago that was severe and lasted for 2 hole days before letting up. She denies fever/chills. She has associated nausea. The frequency of these varies but can be once a week on average. Pain does not radiate. She underwent abdominal ultrasound 11/30/19 which demonstrated innumerable tiny stones up to 6 cm in diameter in the gallbladder. There was no gallbladder wall thickening or pericholecystic fluid. CBD measured 5 mm. I don't have a copy of any recent lab work at this time per se.  PMH: migraines, MEG abdominal pain  PSH: Remote history of umbilical hernia repair through an umbilical incision-unclear if mesh use. She has more recently had a laparoscopic total hysterectomy with umbilical access. Tonsil surgery and right ankle surgery  FHx: Denies FHx of colorectal, breast, endometrial, ovarian or cervical cancer  Social: Denies use of tobacco/drugs; occasional social etoh use. Works in Producer, television/film/video for [employer]  ROS: A comprehensive 10 system review of systems was completed with the patient and pertinent findings as noted above.  The patient is a 54 year old female.   Past Surgical History (Chanel Lonni Fix, CMA; 12/28/2019 10:52 AM) Hysterectomy (not due to cancer) - Complete  Oral Surgery  Tonsillectomy   Diagnostic Studies History (Chanel Lonni Fix, CMA; 12/28/2019 10:52 AM) Colonoscopy  never Mammogram  within last year Pap Smear  >5 years ago  Allergies (Chanel Lonni Fix, CMA; 12/28/2019 10:54 AM) Penicillins  Erythromycin  *DERMATOLOGICALS*  Sulfa Antibiotics  Keflex *CEPHALOSPORINS*  Allergies Reconciled   Medication History (Chanel Lonni Fix, CMA; 12/28/2019 10:54 AM) Amitriptyline HCl (75MG  Tablet, Oral) Active. busPIRone HCl (10MG  Tablet, Oral) Active. Ajovy (225MG /1.5ML Soln Auto-inj, Subcutaneous) Active. Topiramate (100MG  Tablet, Oral) Active. Medications Reconciled  Social History , CMA; 12/28/2019 10:52 AM) Alcohol use  Occasional alcohol use. Caffeine use  Coffee. No drug use  Tobacco use  Former smoker.  Family History , CMA; 12/28/2019 10:52 AM) Arthritis  Father, Mother, Sister. Cancer  Mother. Cerebrovascular Accident  Mother. Migraine Headache  Father, Sister. Seizure disorder  Mother.  Pregnancy / Birth History Micheal Likens, CMA; 12/28/2019 10:52 AM) Age at menarche  10 years. Age of menopause  <44 Gravida  2 Length (months) of breastfeeding  3-6 Maternal age  67-25 Para  2  Other Problems (Chanel 12/30/2019, CMA; 12/28/2019 10:52 AM) Cholelithiasis  Gastroesophageal Reflux Disease  Migraine Headache  Oophorectomy  Bilateral. Thyroid Disease     Review of Systems (Chanel Nolan CMA; 12/28/2019 10:52 AM) General Not Present- Appetite Loss, Chills, Fatigue, Fever, Night Sweats, Weight Gain and Weight Loss. Skin Not Present- Change in Wart/Mole, Dryness, Hives, Jaundice, New Lesions, Non-Healing Wounds, Rash and Ulcer. HEENT Present- Wears glasses/contact lenses. Not Present- Earache, Hearing Loss, Hoarseness, Nose Bleed, Oral Ulcers, Ringing in the Ears, Seasonal Allergies, Sinus Pain, Sore Throat, Visual Disturbances and Yellow Eyes. Respiratory Not Present- Bloody sputum, Chronic Cough, Difficulty Breathing, Snoring and Wheezing. Breast Not Present- Breast Mass, Breast Pain, Nipple Discharge and Skin Changes. Cardiovascular Present- Swelling of Extremities. Not Present- Chest Pain, Difficulty Breathing Lying Down, Leg Cramps,  Palpitations, Rapid Heart Rate and Shortness of Breath. Gastrointestinal Present-  Indigestion. Not Present- Abdominal Pain, Bloating, Bloody Stool, Change in Bowel Habits, Chronic diarrhea, Constipation, Difficulty Swallowing, Excessive gas, Gets full quickly at meals, Hemorrhoids, Nausea, Rectal Pain and Vomiting. Female Genitourinary Present- Painful Urination. Not Present- Frequency, Nocturia, Pelvic Pain and Urgency. Musculoskeletal Present- Back Pain. Not Present- Joint Pain, Joint Stiffness, Muscle Pain, Muscle Weakness and Swelling of Extremities. Neurological Present- Headaches. Not Present- Decreased Memory, Fainting, Numbness, Seizures, Tingling, Tremor, Trouble walking and Weakness. Psychiatric Not Present- Anxiety, Bipolar, Change in Sleep Pattern, Depression, Fearful and Frequent crying. Endocrine Not Present- Cold Intolerance, Excessive Hunger, Hair Changes, Heat Intolerance, Hot flashes and New Diabetes. Hematology Not Present- Blood Thinners, Easy Bruising, Excessive bleeding, Gland problems, HIV and Persistent Infections.  Vitals (Chanel Nolan CMA; 12/28/2019 10:55 AM) 12/28/2019 10:54 AM Weight: 199.13 lb Height: 66in Body Surface Area: 2 m Body Mass Index: 32.14 kg/m  Temp.: 93.37F  Pulse: 95 (Regular)  BP: 120/70(Sitting, Left Arm, Standard)       Physical Exam Harrell Gave M. Lerone Onder MD; 12/28/2019 11:14 AM) The physical exam findings are as follows: Note: Constitutional: No acute distress; conversant; no deformities; wearing mask Eyes: Moist conjunctiva; no lid lag; anicteric sclerae; pupils equal and round Neck: Trachea midline; no palpable thyromegaly Lungs: Normal respiratory effort; no tactile fremitus CV: rrr; no palpable thrill; no pitting edema GI: Abdomen soft, nontender, nondistended; no palpable hepatosplenomegaly MSK: Normal gait; no clubbing/cyanosis Psychiatric: Appropriate affect; alert and oriented 3 Lymphatic: No palpable cervical or  axillary lymphadenopathy    Assessment & Plan Harrell Gave M. Raliegh Scobie MD; 12/28/2019 11:16 AM) SYMPTOMATIC CHOLELITHIASIS (K80.20) Story: [name] is a very pleasant 31yoF with hx of migraines - here for evaluation of what is most clinically consistent with symptomatic cholelithiasis Impression: -The anatomy and physiology of the hepatobiliary system was discussed at length with the patient and her husband with associated pictures. The pathophysiology of gallbladder disease was discussed at length with associated pictures as well -The options for treatment were discussed including ongoing observation which may result in subsequent gallbladder complications (infection, pancreatitis, choledocholithiasis, etc). We therefore reviewed cholecystectomy - laparoscopic and uncommon but potential open techniques -The planned procedure, material risks (including, but not limited to, pain, bleeding, infection, scarring, need for blood transfusion, damage to surrounding structures- blood vessels/nerves/viscus/organs, damage to bile duct, bile leak, need for additional procedures, hernia, pancreatitis, pneumonia, heart attack, stroke, death) benefits and alternatives to surgery were discussed at length. I noted a good probability that the procedure would help improve her symptoms. The patient's questions were answered to her satisfaction, she voiced understanding and they elected to proceed with surgery. Additionally, we discussed typical postoperative expectations and the recovery process.  This patient encounter took 34 minutes today to perform the following: take history, perform exam, review outside records, interpret imaging, counsel the patient on their diagnosis and document encounter, findings & plan in the EHR  Signed by Ileana Roup, MD (12/28/2019 11:16 AM)

## 2020-01-04 ENCOUNTER — Encounter (HOSPITAL_COMMUNITY)
Admission: RE | Admit: 2020-01-04 | Discharge: 2020-01-04 | Disposition: A | Discharge status: Home / Self Care | Payer: BC Managed Care – PPO | Source: Ambulatory Visit | Attending: Surgery | Admitting: Surgery

## 2020-01-04 ENCOUNTER — Encounter (HOSPITAL_COMMUNITY): Admission: RE | Admit: 2020-01-04 | Payer: BC Managed Care – PPO | Source: Ambulatory Visit

## 2020-01-04 ENCOUNTER — Other Ambulatory Visit: Payer: Self-pay

## 2020-01-04 DIAGNOSIS — Z01812 Encounter for preprocedural laboratory examination: Secondary | ICD-10-CM | POA: Diagnosis not present

## 2020-01-04 LAB — CBC WITH DIFFERENTIAL/PLATELET
Abs Immature Granulocytes: 0.01 10*3/uL (ref 0.00–0.07)
Basophils Absolute: 0.1 10*3/uL (ref 0.0–0.1)
Basophils Relative: 1 %
Eosinophils Absolute: 0 10*3/uL (ref 0.0–0.5)
Eosinophils Relative: 0 %
HCT: 40.7 % (ref 36.0–46.0)
Hemoglobin: 13.3 g/dL (ref 12.0–15.0)
Immature Granulocytes: 0 %
Lymphocytes Relative: 45 %
Lymphs Abs: 2.4 10*3/uL (ref 0.7–4.0)
MCH: 29 pg (ref 26.0–34.0)
MCHC: 32.7 g/dL (ref 30.0–36.0)
MCV: 88.9 fL (ref 80.0–100.0)
Monocytes Absolute: 0.3 10*3/uL (ref 0.1–1.0)
Monocytes Relative: 5 %
Neutro Abs: 2.6 10*3/uL (ref 1.7–7.7)
Neutrophils Relative %: 49 %
Platelets: 284 10*3/uL (ref 150–400)
RBC: 4.58 MIL/uL (ref 3.87–5.11)
RDW: 12.8 % (ref 11.5–15.5)
WBC: 5.4 10*3/uL (ref 4.0–10.5)
nRBC: 0 % (ref 0.0–0.2)

## 2020-01-04 LAB — COMPREHENSIVE METABOLIC PANEL
ALT: 15 U/L (ref 0–44)
AST: 16 U/L (ref 15–41)
Albumin: 4.2 g/dL (ref 3.5–5.0)
Alkaline Phosphatase: 47 U/L (ref 38–126)
Anion gap: 5 (ref 5–15)
BUN: 12 mg/dL (ref 6–20)
CO2: 25 mmol/L (ref 22–32)
Calcium: 9.2 mg/dL (ref 8.9–10.3)
Chloride: 111 mmol/L (ref 98–111)
Creatinine, Ser: 0.86 mg/dL (ref 0.44–1.00)
GFR calc Af Amer: >60 mL/min (ref >60)
GFR calc non Af Amer: >60 mL/min (ref >60)
Glucose, Bld: 105 mg/dL — ABNORMAL HIGH (ref 70–99)
Potassium: 4.5 mmol/L (ref 3.5–5.1)
Sodium: 141 mmol/L (ref 135–145)
Total Bilirubin: 0.8 mg/dL (ref 0.3–1.2)
Total Protein: 7.3 g/dL (ref 6.5–8.1)

## 2020-01-04 LAB — PROTIME-INR
INR: 1 (ref 0.8–1.2)
Prothrombin Time: 12.6 seconds (ref 11.4–15.2)

## 2020-01-04 LAB — APTT: aPTT: 34 seconds (ref 24–36)

## 2020-01-05 ENCOUNTER — Other Ambulatory Visit (HOSPITAL_COMMUNITY)
Admission: RE | Admit: 2020-01-05 | Discharge: 2020-01-05 | Disposition: A | Discharge status: Home / Self Care | Payer: BC Managed Care – PPO | Source: Ambulatory Visit | Attending: Surgery | Admitting: Surgery

## 2020-01-05 DIAGNOSIS — Z01812 Encounter for preprocedural laboratory examination: Secondary | ICD-10-CM | POA: Insufficient documentation

## 2020-01-05 DIAGNOSIS — Z20822 Contact with and (suspected) exposure to covid-19: Secondary | ICD-10-CM | POA: Insufficient documentation

## 2020-01-05 LAB — SARS CORONAVIRUS 2 (TAT 6-24 HRS): SARS Coronavirus 2: NEGATIVE

## 2020-01-07 MED ORDER — BUPIVACAINE LIPOSOME 1.3 % IJ SUSP
20.0000 mL | INTRAMUSCULAR | Status: DC
  Filled 2020-01-07: qty 20

## 2020-01-07 MED ORDER — GENTAMICIN SULFATE 40 MG/ML IJ SOLN
5.0000 mg/kg | INTRAVENOUS | Status: AC
  Administered 2020-01-08: 360 mg via INTRAVENOUS
  Filled 2020-01-07: qty 9

## 2020-01-08 ENCOUNTER — Ambulatory Visit (HOSPITAL_COMMUNITY)
Admission: RE | Admit: 2020-01-08 | Discharge: 2020-01-08 | Disposition: A | Discharge status: Home / Self Care | Payer: BC Managed Care – PPO | Attending: Surgery | Admitting: Surgery

## 2020-01-08 ENCOUNTER — Ambulatory Visit (HOSPITAL_COMMUNITY): Payer: BC Managed Care – PPO | Admitting: Physician Assistant

## 2020-01-08 ENCOUNTER — Encounter (HOSPITAL_COMMUNITY)
Admission: RE | Disposition: A | Discharge status: Home / Self Care | Payer: Self-pay | Source: Home / Self Care | Attending: Surgery

## 2020-01-08 ENCOUNTER — Ambulatory Visit (HOSPITAL_COMMUNITY): Payer: BC Managed Care – PPO | Admitting: Anesthesiology

## 2020-01-08 ENCOUNTER — Telehealth (HOSPITAL_COMMUNITY): Payer: Self-pay | Admitting: *Deleted

## 2020-01-08 ENCOUNTER — Encounter (HOSPITAL_COMMUNITY): Payer: Self-pay | Admitting: Surgery

## 2020-01-08 DIAGNOSIS — Z7982 Long term (current) use of aspirin: Secondary | ICD-10-CM | POA: Diagnosis not present

## 2020-01-08 DIAGNOSIS — K219 Gastro-esophageal reflux disease without esophagitis: Secondary | ICD-10-CM | POA: Insufficient documentation

## 2020-01-08 DIAGNOSIS — Z87891 Personal history of nicotine dependence: Secondary | ICD-10-CM | POA: Insufficient documentation

## 2020-01-08 DIAGNOSIS — Z79899 Other long term (current) drug therapy: Secondary | ICD-10-CM | POA: Insufficient documentation

## 2020-01-08 DIAGNOSIS — K8020 Calculus of gallbladder without cholecystitis without obstruction: Secondary | ICD-10-CM | POA: Diagnosis not present

## 2020-01-08 DIAGNOSIS — G43909 Migraine, unspecified, not intractable, without status migrainosus: Secondary | ICD-10-CM | POA: Insufficient documentation

## 2020-01-08 DIAGNOSIS — K8010 Calculus of gallbladder with chronic cholecystitis without obstruction: Secondary | ICD-10-CM | POA: Insufficient documentation

## 2020-01-08 DIAGNOSIS — E079 Disorder of thyroid, unspecified: Secondary | ICD-10-CM | POA: Insufficient documentation

## 2020-01-08 HISTORY — PX: CHOLECYSTECTOMY: SHX55

## 2020-01-08 SURGERY — LAPAROSCOPIC CHOLECYSTECTOMY
Anesthesia: General | Site: Abdomen

## 2020-01-08 MED ORDER — ONDANSETRON HCL 4 MG/2ML IJ SOLN
INTRAMUSCULAR | Status: DC | PRN
  Administered 2020-01-08: 4 mg via INTRAVENOUS

## 2020-01-08 MED ORDER — MEPERIDINE HCL 50 MG/ML IJ SOLN: 6.2500 mg | INTRAMUSCULAR | Status: DC | PRN

## 2020-01-08 MED ORDER — CHLORHEXIDINE GLUCONATE CLOTH 2 % EX PADS: 6.0000 | MEDICATED_PAD | Freq: Once | CUTANEOUS | Status: DC

## 2020-01-08 MED ORDER — TRAMADOL HCL 50 MG PO TABS: 50.0000 mg | ORAL_TABLET | Freq: Four times a day (QID) | ORAL | 0 refills | Status: AC | PRN

## 2020-01-08 MED ORDER — SUGAMMADEX SODIUM 200 MG/2ML IV SOLN
INTRAVENOUS | Status: DC | PRN
  Administered 2020-01-08: 200 mg via INTRAVENOUS

## 2020-01-08 MED ORDER — CLINDAMYCIN PHOSPHATE 900 MG/50ML IV SOLN
900.0000 mg | INTRAVENOUS | Status: AC
  Administered 2020-01-08: 900 mg via INTRAVENOUS
  Filled 2020-01-08: qty 50

## 2020-01-08 MED ORDER — SCOPOLAMINE 1 MG/3DAYS TD PT72
MEDICATED_PATCH | TRANSDERMAL | Status: AC
  Filled 2020-01-08: qty 1

## 2020-01-08 MED ORDER — OXYCODONE HCL 5 MG/5ML PO SOLN: 5.0000 mg | Freq: Once | ORAL | Status: DC | PRN

## 2020-01-08 MED ORDER — SCOPOLAMINE 1 MG/3DAYS TD PT72
1.0000 | MEDICATED_PATCH | TRANSDERMAL | Status: DC
  Administered 2020-01-08: 1.5 mg via TRANSDERMAL

## 2020-01-08 MED ORDER — ACETAMINOPHEN 500 MG PO TABS
1000.0000 mg | ORAL_TABLET | ORAL | Status: AC
  Administered 2020-01-08: 1000 mg via ORAL
  Filled 2020-01-08: qty 2

## 2020-01-08 MED ORDER — ACETAMINOPHEN 160 MG/5ML PO SOLN: 325.0000 mg | ORAL | Status: DC | PRN

## 2020-01-08 MED ORDER — LIDOCAINE 2% (20 MG/ML) 5 ML SYRINGE
INTRAMUSCULAR | Status: DC | PRN
  Administered 2020-01-08: 100 mg via INTRAVENOUS

## 2020-01-08 MED ORDER — PROPOFOL 10 MG/ML IV BOLUS
INTRAVENOUS | Status: AC
  Filled 2020-01-08: qty 20

## 2020-01-08 MED ORDER — FENTANYL CITRATE (PF) 100 MCG/2ML IJ SOLN
INTRAMUSCULAR | Status: DC | PRN
  Administered 2020-01-08 (×4): 50 ug via INTRAVENOUS

## 2020-01-08 MED ORDER — FENTANYL CITRATE (PF) 100 MCG/2ML IJ SOLN
INTRAMUSCULAR | Status: AC
  Filled 2020-01-08: qty 2

## 2020-01-08 MED ORDER — BUPIVACAINE LIPOSOME 1.3 % IJ SUSP
INTRAMUSCULAR | Status: DC | PRN
  Administered 2020-01-08: 15 mL

## 2020-01-08 MED ORDER — BUPIVACAINE HCL 0.25 % IJ SOLN
INTRAMUSCULAR | Status: AC
  Filled 2020-01-08: qty 1

## 2020-01-08 MED ORDER — FENTANYL CITRATE (PF) 100 MCG/2ML IJ SOLN
INTRAMUSCULAR | Status: AC
  Administered 2020-01-08: 12:00:00 50 ug via INTRAVENOUS
  Filled 2020-01-08: qty 2

## 2020-01-08 MED ORDER — PROPOFOL 10 MG/ML IV BOLUS
INTRAVENOUS | Status: DC | PRN
  Administered 2020-01-08: 130 mg via INTRAVENOUS

## 2020-01-08 MED ORDER — MIDAZOLAM HCL 5 MG/5ML IJ SOLN
INTRAMUSCULAR | Status: DC | PRN
  Administered 2020-01-08: 2 mg via INTRAVENOUS

## 2020-01-08 MED ORDER — LACTATED RINGERS IR SOLN
Status: DC | PRN
  Administered 2020-01-08: 1000 mL

## 2020-01-08 MED ORDER — FENTANYL CITRATE (PF) 100 MCG/2ML IJ SOLN
25.0000 ug | INTRAMUSCULAR | Status: DC | PRN
  Administered 2020-01-08: 50 ug via INTRAVENOUS

## 2020-01-08 MED ORDER — 0.9 % SODIUM CHLORIDE (POUR BTL) OPTIME
TOPICAL | Status: DC | PRN
  Administered 2020-01-08: 11:00:00 1000 mL

## 2020-01-08 MED ORDER — OXYCODONE HCL 5 MG PO TABS: 5.0000 mg | ORAL_TABLET | Freq: Once | ORAL | Status: DC | PRN

## 2020-01-08 MED ORDER — ONDANSETRON HCL 4 MG/2ML IJ SOLN: 4.0000 mg | Freq: Once | INTRAMUSCULAR | Status: DC | PRN

## 2020-01-08 MED ORDER — ACETAMINOPHEN 325 MG PO TABS: 325.0000 mg | ORAL_TABLET | ORAL | Status: DC | PRN

## 2020-01-08 MED ORDER — ROCURONIUM BROMIDE 10 MG/ML (PF) SYRINGE
PREFILLED_SYRINGE | INTRAVENOUS | Status: DC | PRN
  Administered 2020-01-08: 50 mg via INTRAVENOUS

## 2020-01-08 MED ORDER — BUPIVACAINE HCL (PF) 0.25 % IJ SOLN
INTRAMUSCULAR | Status: DC | PRN
  Administered 2020-01-08: 15 mL

## 2020-01-08 MED ORDER — MIDAZOLAM HCL 2 MG/2ML IJ SOLN
INTRAMUSCULAR | Status: AC
  Filled 2020-01-08: qty 2

## 2020-01-08 MED ORDER — DEXAMETHASONE SODIUM PHOSPHATE 10 MG/ML IJ SOLN
INTRAMUSCULAR | Status: DC | PRN
  Administered 2020-01-08: 10 mg via INTRAVENOUS

## 2020-01-08 MED ORDER — LACTATED RINGERS IV SOLN: INTRAVENOUS | Status: DC

## 2020-01-08 SURGICAL SUPPLY — 42 items
APPLICATOR ARISTA FLEXITIP XL (MISCELLANEOUS) IMPLANT
APPLIER CLIP 5 13 M/L LIGAMAX5 (MISCELLANEOUS) ×3
APPLIER CLIP ROT 10 11.4 M/L (STAPLE)
CABLE HIGH FREQUENCY MONO STRZ (ELECTRODE) ×3 IMPLANT
CHLORAPREP W/TINT 26 (MISCELLANEOUS) ×3 IMPLANT
CLIP APPLIE 5 13 M/L LIGAMAX5 (MISCELLANEOUS) ×1 IMPLANT
CLIP APPLIE ROT 10 11.4 M/L (STAPLE) IMPLANT
COVER MAYO STAND STRL (DRAPES) IMPLANT
COVER SURGICAL LIGHT HANDLE (MISCELLANEOUS) ×3 IMPLANT
COVER WAND RF STERILE (DRAPES) IMPLANT
DECANTER SPIKE VIAL GLASS SM (MISCELLANEOUS) ×1 IMPLANT
DERMABOND ADVANCED (GAUZE/BANDAGES/DRESSINGS) ×2
DERMABOND ADVANCED .7 DNX12 (GAUZE/BANDAGES/DRESSINGS) ×1 IMPLANT
DISSECTOR BLUNT TIP ENDO 5MM (MISCELLANEOUS) IMPLANT
DRAPE C-ARM 42X120 X-RAY (DRAPES) IMPLANT
ELECT REM PT RETURN 15FT ADLT (MISCELLANEOUS) ×3 IMPLANT
GLOVE BIO SURGEON STRL SZ7.5 (GLOVE) ×3 IMPLANT
GLOVE INDICATOR 8.0 STRL GRN (GLOVE) ×3 IMPLANT
GOWN STRL REUS W/TWL XL LVL3 (GOWN DISPOSABLE) ×6 IMPLANT
GRASPER SUT TROCAR 14GX15 (MISCELLANEOUS) ×2 IMPLANT
HEMOSTAT ARISTA ABSORB 3G PWDR (HEMOSTASIS) IMPLANT
HEMOSTAT SNOW SURGICEL 2X4 (HEMOSTASIS) IMPLANT
KIT BASIN (CUSTOM PROCEDURE TRAY) ×3 IMPLANT
KIT TURNOVER KIT A (KITS) IMPLANT
NDL INSUFFLATION 14GA 120MM (NEEDLE) IMPLANT
NEEDLE INSUFFLATION 14GA 120MM (NEEDLE) IMPLANT
PENCIL SMOKE EVACUATOR (MISCELLANEOUS) IMPLANT
POUCH SPECIMEN RETRIEVAL 10MM (ENDOMECHANICALS) ×3 IMPLANT
SCISSORS LAP 5X35 DISP (ENDOMECHANICALS) ×3 IMPLANT
SET CHOLANGIOGRAPH MIX (MISCELLANEOUS) IMPLANT
SET IRRIG TUBING LAPAROSCOPIC (IRRIGATION / IRRIGATOR) ×3 IMPLANT
SET TUBE SMOKE EVAC HIGH FLOW (TUBING) ×3 IMPLANT
SLEEVE ADV FIXATION 5X100MM (TROCAR) ×6 IMPLANT
SUT MNCRL AB 4-0 PS2 18 (SUTURE) ×3 IMPLANT
SYR 10ML ECCENTRIC (SYRINGE) ×3 IMPLANT
TOWEL OR 17X26 10 PK STRL BLUE (TOWEL DISPOSABLE) ×3 IMPLANT
TOWEL OR NON WOVEN STRL DISP B (DISPOSABLE) IMPLANT
TRAY LAPAROSCOPIC (CUSTOM PROCEDURE TRAY) ×3 IMPLANT
TROCAR ADV FIXATION 12X100MM (TROCAR) IMPLANT
TROCAR ADV FIXATION 5X100MM (TROCAR) ×3 IMPLANT
TROCAR XCEL BLUNT TIP 100MML (ENDOMECHANICALS) ×3 IMPLANT
TROCAR XCEL NON-BLD 11X100MML (ENDOMECHANICALS) IMPLANT

## 2020-01-08 NOTE — Anesthesia Preprocedure Evaluation (Addendum)
Anesthesia Evaluation  Patient identified by MRN, date of birth, ID band Patient awake    Reviewed: Allergy & Precautions, H&P , NPO status , Patient's Chart, lab work & pertinent test results, reviewed documented beta blocker date and time   Airway Mallampati: II  TM Distance: >3 FB Neck ROM: full    Dental no notable dental hx. (+) Teeth Intact, Dental Advisory Given   Pulmonary neg pulmonary ROS, Patient abstained from smoking., former smoker,    Pulmonary exam normal breath sounds clear to auscultation       Cardiovascular Exercise Tolerance: Good negative cardio ROS   Rhythm:regular Rate:Normal     Neuro/Psych  Headaches, negative psych ROS   GI/Hepatic negative GI ROS, Neg liver ROS,   Endo/Other  negative endocrine ROS  Renal/GU negative Renal ROS  negative genitourinary   Musculoskeletal   Abdominal   Peds  Hematology negative hematology ROS (+)   Anesthesia Other Findings   Reproductive/Obstetrics negative OB ROS                            Anesthesia Physical Anesthesia Plan  ASA: II  Anesthesia Plan: General   Post-op Pain Management:    Induction: Intravenous  PONV Risk Score and Plan: 3 and Ondansetron, Dexamethasone, Treatment may vary due to age or medical condition and Scopolamine patch - Pre-op  Airway Management Planned: Oral ETT  Additional Equipment:   Intra-op Plan:   Post-operative Plan: Extubation in OR  Informed Consent: I have reviewed the patients History and Physical, chart, labs and discussed the procedure including the risks, benefits and alternatives for the proposed anesthesia with the patient or authorized representative who has indicated his/her understanding and acceptance.     Dental Advisory Given  Plan Discussed with: CRNA, Anesthesiologist and Surgeon  Anesthesia Plan Comments: (  )       Anesthesia Quick Evaluation

## 2020-01-08 NOTE — Interval H&P Note (Signed)
History and Physical Interval Note:  01/08/2020 9:50 AM  Mary Spencer  has presented today for surgery, with the diagnosis of SYMPTOMATIC CHOLELITHIASIS.  The various methods of treatment have been discussed with the patient and family. After consideration of risks, benefits and other options for treatment, the patient has consented to  Procedure(s): LAPAROSCOPIC CHOLECYSTECTOMY (N/A) as a surgical intervention.  The patient's history has been reviewed, patient examined, no change in status, stable for surgery.  I have reviewed the patient's chart and labs.  Questions were answered to the patient's satisfaction, she expressed understanding of everything we discussed including technique, material risks, benefits and has opted to proceed with surgery.   Stephanie Coup Larrell Rapozo

## 2020-01-08 NOTE — Anesthesia Postprocedure Evaluation (Signed)
Anesthesia Post Note  Patient: Mary Spencer  Procedure(s) Performed: LAPAROSCOPIC CHOLECYSTECTOMY (N/A Abdomen)     Patient location during evaluation: PACU Anesthesia Type: General Level of consciousness: awake and alert Pain management: pain level controlled Vital Signs Assessment: post-procedure vital signs reviewed and stable Respiratory status: spontaneous breathing, nonlabored ventilation, respiratory function stable and patient connected to nasal cannula oxygen Cardiovascular status: blood pressure returned to baseline and stable Postop Assessment: no apparent nausea or vomiting Anesthetic complications: no    Last Vitals:  Vitals:   01/08/20 1245 01/08/20 1310  BP: 123/76 139/79  Pulse: 79   Resp: (!) 9 16  Temp:  36.6 C  SpO2: 97% 100%    Last Pain:  Vitals:   01/08/20 1310  TempSrc:   PainSc: 6                  Burdette Forehand

## 2020-01-08 NOTE — Op Note (Signed)
01/08/2020 11:38 AM  PATIENT: Mary Spencer  54 y.o. female  Patient Care Team: Ginger Organ as PCP - General (Physician Assistant)  PRE-OPERATIVE DIAGNOSIS: Symptomatic cholelithiasis  POST-OPERATIVE DIAGNOSIS: Chronic cholecystitis  PROCEDURE: Laparoscopic cholecystectomy  SURGEON: Sharon Mt. Desmin Daleo, MD  ANESTHESIA: General endotracheal  EBL: Total I/O In: 700 [I.V.:700] Out: -   DRAINS: None  SPECIMEN: Gallbladder  COUNTS: Sponge, needle and instrument counts were reported correct x2 at the conclusion of the operation  DISPOSITION: PACU in satisfactory condition  COMPLICATIONS: None  FINDINGS: Numerous gallstones. Chronically inflamed appearing gallbladder with thickened pericholecystic fat/edema. Chronic fibrotic scar tissue at cystic duct and along infundibulum consistent with likely prior cholecystitis. Critical view of safety was achieved before clipping or dividing any structures. Minimal spillage of bile.  INDICATION: Mary Spencer is a very pleasant 58yoF with history of migraines and thyroid disease whom has a 6 to 54-month history of midepigastric abdominal discomfort and pain.  This was associated with greasy and fatty foods.  She had been evaluated for this previously and was found to have numerous gallstones on an ultrasound.  She has had one episode of significant pain that lasted longer than her typical pain which was 2 to 3 hours but this 1 lasting 2 days.  Ultrasound 11/30/19 showed innumerable tiny stones up to 6 mm in diameter.  No gallbladder wall thickening or pericholecystic fluid.  Common bile duct measured 5 mm.  Preoperative lab work demonstrated normal LFTs.  Will admit in the office we discussed options going forward.  Given her symptoms consistent with symptomatic cholelithiasis, we reviewed options for surgery.  Please refer to notes elsewhere for details regarding this discussion. She opted to pursue surgery.  DESCRIPTION:  The patient  was identified & brought into the operating room. She was then positioned supine on the OR table. SCDs were in place and active during the entire case. Arms were positioned out. Pressure points were padded. She then underwent general endotracheal anesthesia. Pressure points were padded. The abdomen was prepped and draped in the standard sterile fashion. Antibiotics were administered. A surgical timeout was performed and confirmed our plan.   Given her surgical history and the possibility of an umbilical hernia repair with mesh, we opted to proceed with abdominal entry via the Veress needle technique.  An OG tube had been placed to suction by anesthesia.  At Palmer's point, a small stab incision was created and the Veress needle was inserted in the peritoneal cavity.  Intraperitoneal location was confirmed with the aspiration and saline drop test.  Pneumoperitoneum was established to 15 mmHg of CO2.  A 5 mm Optiview trocar was then placed under direct visualization into the peritoneal cavity at Palmer's point.  The laparoscope was used to inspect the peritoneal cavity demonstrated no evidence of Veress needle or trocar site complication.  Under direct visualization, 2 additional 5 mm trochars were placed in the right upper abdomen and an 11 mm trocar somewhat obliquely just to the right of the falciform ligament.  There were no significant adhesions in her abdomen or to her umbilical hernia repair site.  A 5 mm port was placed just cephalad to the umbilicus.  She was positioned in reverse Trendelenburg with some left side down.  The liver and gallbladder were inspected.  The liver was normal in appearance.  The gallbladder fundus was grasped and elevated cephalad.  Numerous gallstones were visible through the wall.  An additional grasper was then placed on the infundibulum of  the gallbladder and the infundibulum was retracted laterally. Staying high on the gallbladder, the peritoneum on both sides of the  gallbladder was opened with hook cautery.  There was edema within the areolar tissue around the gallbladder.  Gentle blunt dissection was then employed with a Art gallery manager working down into [employer].  There was more fibrotic changes at the level of the infundibulum and the takeoff of the cystic duct likely due to a prior bout of cholecystitis. The cystic duct was identified and carefully circumferentially dissected. The cystic artery was also identified and carefully circumferentially dissected. The space between the cystic artery and hepatocystic plate was developed such that a good view of the liver could be seen through a window medial to the cystic artery. The triangle of Calot had been cleared of all fibrofatty tissue. At this point, a critical view of safety was achieved and the only structures visualized was the skeletonized cystic duct laterally, the skeletonized cystic artery and the liver through the window medial to the artery. No posterior cystic artery was noted  A cholangiogram was not performed at this point as fibrotic nature of the tissues on her cystic duct were felt to increase risk of this fracturing off or being completely transected during attempt at cholangiogram.  The cystic duct and artery were clipped with 2 clips on the patient side and 1 clip on the specimen side (titanium clips used on cystic artery and hemolock clips on the cystic duct. The cystic duct and artery were then divided. The gallbladder was then freed from its remaining attachments to the liver using electrocautery. One small hole was noted in the wall during freeing it from the liver with minimal spillage of bile. The gallbladder was freed and placed into an endocatch bag. The RUQ was gently irrigated with sterile saline. Hemostasis was then verified. The clips were in good position; the gallbladder fossa was dry. The rest of the abdomen was inspected and no injury nor bleeding elsewhere was  identified.  The endocatch bag containing the gallbladder was then removed from the subxiphoid port site and passed off as specimen.  This port site was closed using a 0 Vicryl suture with a laparoscopic suture passer.  The defect in the fascia was palpated and noted to be obliterated.  The other RUQ ports were removed under direct visualization and noted to be hemostatic.  CO2 was then exhausted and the umbilical trocar was removed.  The skin of all incision sites was approximated with 4-0 monocryl subcuticular suture and dermabond applied. She was then awakened from anesthesia, extubated, and transferred to a stretcher for transport to PACU in satisfactory condition.

## 2020-01-08 NOTE — Anesthesia Procedure Notes (Signed)
Procedure Name: Intubation Date/Time: 01/08/2020 11:00 AM Performed by: Lavina Hamman, CRNA Pre-anesthesia Checklist: Patient identified, Emergency Drugs available, Suction available, Patient being monitored and Timeout performed Patient Re-evaluated:Patient Re-evaluated prior to induction Oxygen Delivery Method: Circle system utilized Preoxygenation: Pre-oxygenation with 100% oxygen Induction Type: IV induction Ventilation: Mask ventilation without difficulty Laryngoscope Size: Mac and 3 Grade View: Grade II Tube type: Oral Tube size: 7.0 mm Number of attempts: 1 Airway Equipment and Method: Stylet Placement Confirmation: ETT inserted through vocal cords under direct vision,  positive ETCO2,  CO2 detector and breath sounds checked- equal and bilateral Secured at: 21 cm Tube secured with: Tape Dental Injury: Teeth and Oropharynx as per pre-operative assessment  Comments: ATOI

## 2020-01-08 NOTE — Transfer of Care (Signed)
Immediate Anesthesia Transfer of Care Note  Patient: Mary Spencer  Procedure(s) Performed: LAPAROSCOPIC CHOLECYSTECTOMY (N/A Abdomen)  Patient Location: PACU  Anesthesia Type:General  Level of Consciousness: awake, alert  and oriented  Airway & Oxygen Therapy: Patient Spontanous Breathing and Patient connected to face mask oxygen  Post-op Assessment: Report given to RN  Post vital signs: Reviewed and stable  Last Vitals:  Vitals Value Taken Time  BP 117/90 01/08/20 1145  Temp    Pulse 92 01/08/20 1147  Resp 12 01/08/20 1147  SpO2 100 % 01/08/20 1147  Vitals shown include unvalidated device data.  Last Pain:  Vitals:   01/08/20 0843  TempSrc:   PainSc: 0-No pain         Complications: No apparent anesthesia complications

## 2020-01-08 NOTE — Discharge Instructions (Signed)
POST OP INSTRUCTIONS  1. DIET: As tolerated. Follow a light bland diet the first 24 hours after arrival home, such as soup, liquids, crackers, etc.  Be sure to include lots of fluids daily.  Avoid fast food or heavy meals as your are more likely to get nauseated.  Eat a low fat the next few days after surgery.  2. Take your usually prescribed home medications unless otherwise directed.  3. PAIN CONTROL: a. Pain is best controlled by a usual combination of three different methods TOGETHER: i. Ice/Heat ii. Over the counter pain medication iii. Prescription pain medication b. Most patients will experience some swelling and bruising around the surgical site.  Ice packs or heating pads (30-60 minutes up to 6 times a day) will help. Some people prefer to use ice alone, heat alone, alternating between ice & heat.  Experiment to what works for you.  Swelling and bruising can take several weeks to resolve.   c. It is helpful to take an over-the-counter pain medication regularly for the first few weeks: i. Ibuprofen (Motrin/Advil) - 200mg tabs - take 3 tabs (600mg) every 6 hours as needed for pain ii. Acetaminophen (Tylenol) - you may take 650mg every 6 hours as needed. You can take this with motrin as they act differently on the body. If you are taking a narcotic pain medication that has acetaminophen in it, do not take over the counter tylenol at the same time.  Iii. NOTE: You may take both of these medications together - most patients  find it most helpful when alternating between the two (i.e. Ibuprofen at 6am, tylenol at 9am, ibuprofen at 12pm ...) d. A  prescription for pain medication should be given to you upon discharge.  Take your pain medication as prescribed if your pain is not adequatly controlled with the over-the-counter pain reliefs mentioned above.  4. Avoid getting constipated.  Between the surgery and the pain medications, it is common to experience some constipation.  Increasing fluid  intake and taking a fiber supplement (such as Metamucil, Citrucel, FiberCon, MiraLax, etc) 1-2 times a day regularly will usually help prevent this problem from occurring.  A mild laxative (prune juice, Milk of Magnesia, MiraLax, etc) should be taken according to package directions if there are no bowel movements after 48 hours.    5. Dressing: Your incision are covered in Dermabond which is like sterile superglue for the skin. This will come off on it's own in a couple weeks. It is waterproof and you may bathe normally starting the day after your surgery in a shower. Avoid baths/pools/lakes/oceans until your wounds have fully healed.  6. ACTIVITIES as tolerated:   a. Avoid heavy lifting (>10lbs or 1 gallon of milk) for the next 6 weeks. b. You may resume regular (light) daily activities beginning the next day--such as daily self-care, walking, climbing stairs--gradually increasing activities as tolerated.  If you can walk 30 minutes without difficulty, it is safe to try more intense activity such as jogging, treadmill, bicycling, low-impact aerobics.  c. DO NOT PUSH THROUGH PAIN.  Let pain be your guide: If it hurts to do something, don't do it. d. You may drive when you are no longer taking prescription pain medication, you can comfortably wear a seatbelt, and you can safely maneuver your car and apply brakes.  7. FOLLOW UP in our office a. Please call [facility] at [phone] to set up an appointment to see your surgeon in the office for a follow-up appointment approximately   2 weeks after your surgery. b. Make sure that you call for this appointment the day you arrive home to insure a convenient appointment time.  9. If you have disability or family leave forms that need to be completed, you may have them completed by your primary care physician's office; for return to work instructions, please ask our office staff and they will be happy to assist you in obtaining this documentation   When to call us  [phone]: 1. Poor pain control 2. Reactions / problems with new medications (rash/itching, etc)  3. Fever over 101.5 F (38.5 C) 4. Inability to urinate 5. Nausea/vomiting 6. Worsening swelling or bruising 7. Continued bleeding from incision. 8. Increased pain, redness, or drainage from the incision  The clinic staff is available to answer your questions during regular business hours (8:30am-5pm).  Please don't hesitate to call and ask to speak to one of our nurses for clinical concerns.   A surgeon from [facility] is always on call at the hospitals   If you have a medical emergency, go to the nearest emergency room or call 911.  [facility] [address], [address], [place], [place]  [address] MAIN: [phone] FAX: [phone] [email or web address] 

## 2020-01-11 LAB — SURGICAL PATHOLOGY

## 2020-05-13 DIAGNOSIS — Z20822 Contact with and (suspected) exposure to covid-19: Secondary | ICD-10-CM | POA: Diagnosis not present

## 2020-05-20 DIAGNOSIS — Z20822 Contact with and (suspected) exposure to covid-19: Secondary | ICD-10-CM | POA: Diagnosis not present

## 2020-10-31 ENCOUNTER — Other Ambulatory Visit: Payer: Self-pay | Admitting: Neurology

## 2020-10-31 DIAGNOSIS — G43709 Chronic migraine without aura, not intractable, without status migrainosus: Secondary | ICD-10-CM

## 2020-11-15 NOTE — Progress Notes (Signed)
[facility]    Provider:  Dr Lucia Gaskins Referring Provider: Samuella Bruin Primary Care Physician:  Edwina Barth Perry, *  CC:  Migraines  11/16/2020: The Ajovy made it worse 3 months, more frequent mild headaches. Aimovig only did it once.  Stopping the ajovy made it go away. She still has dizziness stable. She is having diplopia, side by side, occasionally randomly, she is going to talk to the optometrist, she is due for new glasses, started 3-4 months ago, no head trauma or new medications, she feels tired and stressed all the time and her job is extremely stressful. She started smoking again, she is trying to quit, it is getting more frequent, closing one eye helps. She still have the vestibular migraines. No headaches, just daily vertigo/dizziness, room spinning also feeling like being on the ocean but can have frank room spinning. Chronic daily continuous. Diplopia is binocular.   Patient complains of symptoms per HPI as well as the following symptoms: vertigo, stress . Pertinent negatives and positives per HPI. All others negative  Interval history 11/16/2019: Could not get the Aimovig. We will try Ajovy, gave her 3 months samples in the meantime we will try and get it approved. She is stable, she has a lot of dizziness and vestibular symptomsshe is having more headaches, more stress at work, daily vertigo and dizziness, daily headaches and symptoms even if mild, tylenol helps with motrin and that usually helps, she has maxalt and that helps, she has to take maxalt 8 days a month for migraines.   HPI:  Mary Spencer is a 55 y.o. female here as requested by Dr. Loreta Ave for chronic migraines.  Past medical history of chronic migraines presenting with dizzy spells and vertigo, chronic anxiety.  She has been managed by neurology in the past for her migraines.  She also has a history of thyroid disease.She has been to ENT multiple times and several of them, she has had  extensive cardiac workup. 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 She can have headaches. Maxalt works well. Headaches are pulsating and pounding, light and sound sensitivity and nausea and vomiting.  She has daily vertigo and dizziness, it is just normal to her and she will set her hand on something and sits a lot, She has headaches which are minor 12 days a month and her main symptoms daily are vestibular. Nerve blocks helped in the past every 3 months occipital nerve blocks. She ses a [place]. She had a neurologist in [place]. She sees Dr. Lois Huxley chiropractor. Migraines can last 24-72 hours. She is on Topiramate and Alavil. No new symptoms or focal deficits, no hearing changes, no vision changes. She had had extended eeg, multiple MRIs. No other focal neurologic deficits, associated symptoms, inciting events or modifiable factors.  Meds tried: Elavil, Topiramate  Reviewed notes, labs and imaging from outside physicians, which showed:  Reviewed emergency room notes patient was seen in September 2019.  Patient moved to the area several months prior and had not been able to establish with a PCP.  She is followed with a neurologist for chronic migraine in the past which presented as dizziness and vertigo type symptoms.  Also stated she gets injections to the base of her head which she has not been  getting.  No changes in her headaches currently.  She is on amitriptyline, topiramate and BuSpar.  Vitals, labs and exam were normal in the emergency room.  Patient was seen at Dr. Latrelle DodrillSagardia's office to establish care in October 2019.  She recently moved to this area from [place].  Has a history of chronic migraines presenting with dizzy spells and vertigo.  Also has a history of  chronic anxiety.  Has seen neurology in the past.  Stated she had dizziness and headaches infrequently.  Physical exam and neurologic exam was normal.  No changes were made to her medications.  Will request notes from prior neurologist's office  Review of Systems: Patient complains of symptoms per HPI as well as the following symptoms: headache, dizziness. Pertinent negatives and positives per HPI. All others negative.   Social History   Socioeconomic History  . Marital status: Married    Spouse name: Not on file  . Number of children: 2  . Years of education: Not on file  . Highest education level: Bachelor's degree (e.g., BA, AB, BS)  Occupational History  . Not on file  Tobacco Use  . Smoking status: Current Every Day Smoker    Packs/day: 0.50    Types: Cigarettes    Last attempt to quit: 11/2018    Years since quitting: 2.0  . Smokeless tobacco: Never Used  Vaping Use  . Vaping Use: Never used  Substance and Sexual Activity  . Alcohol use: Yes    Comment: "very rarely, maybe 1 drink on a holiday"  . Drug use: Never  . Sexual activity: Not on file  Other Topics Concern  . Not on file  Social History Narrative   Lives at home with her husband    Right handed   Caffeine: 2-3 cups daily   Social Determinants of Health   Financial Resource Strain: Not on file  Food Insecurity: Not on file  Transportation Needs: Not on file  Physical Activity: Not on file  Stress: Not on file  Social Connections: Not on file  Intimate Partner Violence: Not on file    Family History  Problem Relation Age of Onset  . Stroke Mother   . Seizures Mother   . Cancer Mother   . Macular degeneration Father   . Migraines Sister        cluster  . Stroke Maternal Grandmother   . Seizures Maternal Grandmother   . Cancer Maternal Grandfather   . Migraines Sister        cluster    Past Medical History:  Diagnosis Date  . Ankle fracture 06/2019   right   . Finger fracture 06/2019    Right hand, 5th finger  . Migraines   . Pneumonia   . Thyroid disease     Past Surgical History:  Procedure Laterality Date  . ABDOMINAL HYSTERECTOMY    . BUNIONECTOMY Left   . CHOLECYSTECTOMY N/A 01/08/2020   Procedure: LAPAROSCOPIC CHOLECYSTECTOMY;  Surgeon: Andria MeuseWhite, Christopher M, MD;  Location: [facility];  Service: General;  Laterality: N/A;  . HERNIA REPAIR    . TONSILLECTOMY      Current Outpatient Medications  Medication Sig Dispense Refill  . acetaminophen (TYLENOL) 500 MG tablet Take 1,000 mg by mouth every 8 (eight) hours as needed for moderate pain or headache.    . Alum Hydroxide-Mag Trisilicate (GAVISCON) 80-14.2 MG CHEW Chew 2 tablets by mouth 2 (two) times daily as needed (heartburn).    . busPIRone (BUSPAR) 10 MG tablet  Take 10 mg by mouth See admin instructions. Take 10 mg daily, may take a second 10 mg dose as needed for anxiety    . calcium carbonate (TUMS - DOSED IN MG ELEMENTAL CALCIUM) 500 MG chewable tablet Chew 2-3 tablets by mouth 5 (five) times daily as needed for indigestion or heartburn.    Marland Kitchen omeprazole (PRILOSEC) 20 MG capsule Take 20 mg by mouth daily.    . rizatriptan (MAXALT-MLT) 10 MG disintegrating tablet Take 1 tablet (10 mg total) by mouth as needed for migraine. May repeat in 2 hours if needed 27 tablet 4  . amitriptyline (ELAVIL) 75 MG tablet Take 1 tablet (75 mg total) by mouth at bedtime. 90 tablet 0  . topiramate (TOPAMAX) 100 MG tablet Take 1 tablet (100 mg total) by mouth at bedtime. 90 tablet 4   No current facility-administered medications for this visit.    Allergies as of 11/16/2020 - Review Complete 11/16/2020  Allergen Reaction Noted  . Penicillins Anaphylaxis 06/02/2018  . Erythromycin Hives 06/02/2018  . Keflex [cephalexin] Hives 06/02/2018  . Sulfa antibiotics Hives 06/02/2018    Vitals: BP 125/76 (BP Location: Right Arm, Patient Position: Sitting)   Pulse 96   Ht 5\' 6"  (1.676 m)   Wt 208 lb (94.3 kg)   BMI 33.57 kg/m  Last  Weight:  Wt Readings from Last 1 Encounters:  11/16/20 208 lb (94.3 kg)   Last Height:   Ht Readings from Last 1 Encounters:  11/16/20 5\' 6"  (1.676 m)   Exam: NAD, pleasant                  Speech:    Speech is normal; fluent and spontaneous with normal comprehension.  Cognition:    The patient is oriented to person, place, and time;     recent and remote memory intact;     language fluent;    Cranial Nerves:    The pupils are equal, round, and reactive to light.Trigeminal sensation is intact and the muscles of mastication are normal. The face is symmetric. The palate elevates in the midline. Hearing intact. Voice is normal. Shoulder shrug is normal. The tongue has normal motion without fasciculations.   Coordination:  No dysmetria  Motor Observation:    No asymmetry, no atrophy, and no involuntary movements noted. Tone:    Normal muscle tone.     Strength:    Strength is V/V in the upper and lower limbs.      Sensation: intact to LT   Assessment/Plan:  Patient with chronic migraines and chronic dizziness and vertigo/room spinning. She has daily symptoms. Has tried and failed medications, possibly central due to migraines but will order MRI brain w/wo contrast thin cuts through IAC to look for schwannoma, masses, lesions and also MRI of thebrain due to new-onset diplopia  MRI brain w/wo contrast thin cuts through IAC to look for strokes, IDIOPATHIC INTRACRANIAL HYPERTENSION,compressive mass, schwannoma, MS or other etiology of chronic vertigo and new onset binocular diplopia Continue Elavil and Topiramate (migraines) and maxalt Vestibular therapy for vertigo/room spinning and daily dizziness  Orders Placed This Encounter  Procedures  . MR BRAIN W WO CONTRAST  . Ambulatory referral to Physical Therapy   Meds ordered this encounter  Medications  . topiramate (TOPAMAX) 100 MG tablet    Sig: Take 1 tablet (100 mg total) by mouth at bedtime.    Dispense:  90 tablet     Refill:  4  . amitriptyline (ELAVIL) 75 MG tablet  Sig: Take 1 tablet (75 mg total) by mouth at bedtime.    Dispense:  90 tablet    Refill:  0     PRIOR:  continue current medications: Elavil, Topiramate Acutely: Maxalt and phenergan Start Ajovy, gave 3 samples and co-pay card  Discussed: There is increased risk for stroke in women with migraine with aura and a  Contraindication for the combined contraceptive pill for use by women who have migraine with aura, which is in line with World Health Organisation recommendations. The risk for women with migraine without aura is lower and other risk factors like smoking are far more likely to increase stroke risk than migraine. There is a recommendation for no smoking and for the use of low estrogen or progestogen only pills particularly for women with migraine with aura. It is important however that women with migraine who are taking the pill do not decide to suddenly stop taking it without discussing this with their doctor. Please discuss with her OB/GYN.  Discussed: To prevent or relieve headaches, try the following: Cool Compress. Lie down and place a cool compress on your head.  Avoid headache triggers. If certain foods or odors seem to have triggered your migraines in the past, avoid them. A headache diary might help you identify triggers.  Include physical activity in your daily routine. Try a daily walk or other moderate aerobic exercise.  Manage stress. Find healthy ways to cope with the stressors, such as delegating tasks on your to-do list.  Practice relaxation techniques. Try deep breathing, yoga, massage and visualization.  Eat regularly. Eating regularly scheduled meals and maintaining a healthy diet might help prevent headaches. Also, drink plenty of fluids.  Follow a regular sleep schedule. Sleep deprivation might contribute to headaches Consider biofeedback. With this mind-body technique, you learn to control certain bodily  functions -- such as muscle tension, heart rate and blood pressure -- to prevent headaches or reduce headache pain.    Proceed to emergency room if you experience new or worsening symptoms or symptoms do not resolve, if you have new neurologic symptoms or if headache is severe, or for any concerning symptom.   Provided education and documentation from American headache Society toolbox including articles on: chronic migraine medication overuse headache, chronic migraines, prevention of migraines, behavioral and other nonpharmacologic treatments for headache.  I spent 30 minutes of face-to-face and non-face-to-face time with patient on the  1. Vestibular migraine   2. Chronic migraine without aura without status migrainosus, not intractable   3. Vertigo   4. Other peripheral vertigo, unspecified ear   5. Diplopia   6. Dizziness   7. Pressure in head    diagnosis.  This included previsit chart review, lab review, study review, order entry, electronic health record documentation, patient education on the different diagnostic and therapeutic options, counseling and coordination of care, risks and benefits of management, compliance, or risk factor reduction   Naomie Dean, MD  [facility] [address] [place], [place] [address]  Phone [phone] Fax [phone]

## 2020-11-16 ENCOUNTER — Encounter: Payer: Self-pay | Admitting: Neurology

## 2020-11-16 ENCOUNTER — Ambulatory Visit: Payer: BC Managed Care – PPO | Admitting: Neurology

## 2020-11-16 VITALS — BP 125/76 | HR 96 | Ht 66.0 in | Wt 208.0 lb

## 2020-11-16 DIAGNOSIS — G43809 Other migraine, not intractable, without status migrainosus: Secondary | ICD-10-CM

## 2020-11-16 DIAGNOSIS — R42 Dizziness and giddiness: Secondary | ICD-10-CM

## 2020-11-16 DIAGNOSIS — R519 Headache, unspecified: Secondary | ICD-10-CM

## 2020-11-16 DIAGNOSIS — H81399 Other peripheral vertigo, unspecified ear: Secondary | ICD-10-CM | POA: Diagnosis not present

## 2020-11-16 DIAGNOSIS — G43709 Chronic migraine without aura, not intractable, without status migrainosus: Secondary | ICD-10-CM

## 2020-11-16 DIAGNOSIS — H532 Diplopia: Secondary | ICD-10-CM

## 2020-11-16 MED ORDER — AMITRIPTYLINE HCL 75 MG PO TABS: 75.0000 mg | ORAL_TABLET | Freq: Every day | ORAL | 0 refills | Status: DC

## 2020-11-16 MED ORDER — TOPIRAMATE 100 MG PO TABS: 100.0000 mg | ORAL_TABLET | Freq: Every day | ORAL | 4 refills | Status: DC

## 2020-11-16 NOTE — Addendum Note (Signed)
Addended by: Naomie Dean B on: 11/16/2020 12:52 PM   Modules accepted: Orders

## 2020-11-16 NOTE — Patient Instructions (Signed)
MRI of the brain Vestibular therapy

## 2020-11-17 ENCOUNTER — Telehealth: Payer: Self-pay | Admitting: Neurology

## 2020-11-17 LAB — BASIC METABOLIC PANEL
BUN/Creatinine Ratio: 16 (ref 9–23)
BUN: 14 mg/dL (ref 6–24)
CO2: 20 mmol/L (ref 20–29)
Calcium: 9.3 mg/dL (ref 8.7–10.2)
Chloride: 108 mmol/L — ABNORMAL HIGH (ref 96–106)
Creatinine, Ser: 0.89 mg/dL (ref 0.57–1.00)
Glucose: 94 mg/dL (ref 65–99)
Potassium: 4.7 mmol/L (ref 3.5–5.2)
Sodium: 145 mmol/L — ABNORMAL HIGH (ref 134–144)
eGFR: 76 mL/min/{1.73_m2} (ref >59)

## 2020-11-17 NOTE — Telephone Encounter (Signed)
MR Brain w/wo contrast Dr. Valaria Good Berkley Harvey: [phone] (exp. 11/16/20 to 12/15/20). Patient is scheduled at [facility] for 11/23/20.

## 2020-11-23 ENCOUNTER — Other Ambulatory Visit: Payer: Self-pay

## 2020-11-23 ENCOUNTER — Ambulatory Visit: Payer: BC Managed Care – PPO

## 2020-11-23 DIAGNOSIS — R42 Dizziness and giddiness: Secondary | ICD-10-CM

## 2020-11-23 DIAGNOSIS — R519 Headache, unspecified: Secondary | ICD-10-CM | POA: Diagnosis not present

## 2020-11-23 DIAGNOSIS — H532 Diplopia: Secondary | ICD-10-CM | POA: Diagnosis not present

## 2020-11-23 DIAGNOSIS — H81399 Other peripheral vertigo, unspecified ear: Secondary | ICD-10-CM

## 2020-11-23 MED ORDER — GADOBENATE DIMEGLUMINE 529 MG/ML IV SOLN
20.0000 mL | Freq: Once | INTRAVENOUS | Status: AC | PRN
Start: 2020-11-23 — End: 2020-11-23
  Administered 2020-11-23: 20 mL via INTRAVENOUS

## 2020-12-07 DIAGNOSIS — Z1331 Encounter for screening for depression: Secondary | ICD-10-CM | POA: Diagnosis not present

## 2020-12-07 DIAGNOSIS — L298 Other pruritus: Secondary | ICD-10-CM | POA: Diagnosis not present

## 2020-12-07 DIAGNOSIS — Z6833 Body mass index (BMI) 33.0-33.9, adult: Secondary | ICD-10-CM | POA: Diagnosis not present

## 2020-12-07 DIAGNOSIS — L0291 Cutaneous abscess, unspecified: Secondary | ICD-10-CM | POA: Diagnosis not present

## 2020-12-20 ENCOUNTER — Ambulatory Visit: Payer: BC Managed Care – PPO

## 2020-12-22 ENCOUNTER — Ambulatory Visit: Payer: BC Managed Care – PPO | Admitting: Physical Therapy

## 2021-01-03 ENCOUNTER — Other Ambulatory Visit: Payer: Self-pay

## 2021-01-03 ENCOUNTER — Ambulatory Visit: Payer: BC Managed Care – PPO | Attending: Physician Assistant | Admitting: Physical Therapy

## 2021-01-03 ENCOUNTER — Encounter: Payer: Self-pay | Admitting: Physical Therapy

## 2021-01-03 DIAGNOSIS — R2681 Unsteadiness on feet: Secondary | ICD-10-CM | POA: Diagnosis not present

## 2021-01-03 DIAGNOSIS — R42 Dizziness and giddiness: Secondary | ICD-10-CM | POA: Diagnosis not present

## 2021-01-03 NOTE — Patient Instructions (Signed)
Tip Card  1.The goal of habituation training is to assist in decreasing symptoms of vertigo, dizziness, or nausea provoked by specific head and body motions. 2.These exercises may initially increase symptoms; however, be persistent and work through symptoms. With repetition and time, the exercises will assist in reducing or eliminating symptoms. 3.Exercises should be stopped and discussed with the therapist if you experience any of the following: - Sudden change or fluctuation in hearing - New onset of ringing in the ears, or increase in current intensity - Any fluid discharge from the ear - Severe pain in neck or back - Extreme nausea     Gaze Stabilization: Tip Card  1.Target must remain in focus, not blurry, and appear stationary while head is in motion. 2.Perform exercises with small head movements (45 to either side of midline). 3.Increase speed of head motion so long as target is in focus. 4.If you wear eyeglasses, be sure you can see target through lens (therapist will give specific instructions for bifocal / progressive lenses). 5.These exercises may provoke dizziness or nausea. Work through these symptoms. If too dizzy, slow head movement slightly. Rest between each exercise. 6.Exercises demand concentration; avoid distractions. 7.For safety, perform standing exercises close to a counter, wall, corner, or next to someone.    Gaze Stabilization: Standing Feet Apart    Feet shoulder width apart, keeping eyes on target on wall __5-6__ feet away, tilt head down 15-30 and move head side to side for _60___ seconds. Repeat while moving head up and down for _60___ seconds. Do __3__ sessions per day. Repeat using target on pattern background.  Copyright  VHI. All rights reserved.

## 2021-01-04 ENCOUNTER — Encounter: Payer: Self-pay | Admitting: Physical Therapy
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
## 2021-01-16 ENCOUNTER — Other Ambulatory Visit: Payer: Self-pay | Admitting: Neurology

## 2021-01-16 DIAGNOSIS — G43709 Chronic migraine without aura, not intractable, without status migrainosus: Secondary | ICD-10-CM

## 2021-01-18 ENCOUNTER — Other Ambulatory Visit: Payer: Self-pay | Admitting: *Deleted

## 2021-01-18 DIAGNOSIS — G43709 Chronic migraine without aura, not intractable, without status migrainosus: Secondary | ICD-10-CM

## 2021-01-18 MED ORDER — TOPIRAMATE 100 MG PO TABS
100.0000 mg | ORAL_TABLET | Freq: Every day | ORAL | 4 refills | Status: DC
Start: 2021-01-18 — End: 2021-09-13

## 2021-01-26 ENCOUNTER — Ambulatory Visit: Payer: BC Managed Care – PPO | Admitting: Physical Therapy

## 2021-01-30 ENCOUNTER — Other Ambulatory Visit: Payer: Self-pay | Admitting: Neurology

## 2021-01-30 DIAGNOSIS — G43709 Chronic migraine without aura, not intractable, without status migrainosus: Secondary | ICD-10-CM

## 2021-05-18 NOTE — Progress Notes (Deleted)
No chief complaint on file.    HISTORY OF PRESENT ILLNESS:  05/18/21 ALL:  Mary Spencer is a 55 y.o. female here today for follow up for vestibular migraines. She continues topiramate 100mg  and amitriptyline 75mg  daily. Rizatriptan helps with abortive therapy. MRI 11/2020 was normal. She was referred to vestibular therapy.  Double vision  11/16/2020 AA: The Ajovy made it worse 3 months, more frequent mild headaches. Aimovig only did it once.  Stopping the ajovy made it go away. She still has dizziness stable. She is having diplopia, side by side, occasionally randomly, she is going to talk to the optometrist, she is due for new glasses, started 3-4 months ago, no head trauma or new medications, she feels tired and stressed all the time and her job is extremely stressful. She started smoking again, she is trying to quit, it is getting more frequent, closing one eye helps. She still have the vestibular migraines. No headaches, just daily vertigo/dizziness, room spinning also feeling like being on the ocean but can have frank room spinning. Chronic daily continuous. Diplopia is binocular.    Patient complains of symptoms per HPI as well as the following symptoms: vertigo, stress . Pertinent negatives and positives per HPI. All others negative   Interval history 11/16/2019 AA: Could not get the Aimovig. We will try Ajovy, gave her 3 months samples in the meantime we will try and get it approved. She is stable, she has a lot of dizziness and vestibular symptomsshe is having more headaches, more stress at work, daily vertigo and dizziness, daily headaches and symptoms even if mild, tylenol helps with motrin and that usually helps, she has maxalt and that helps, she has to take maxalt 8 days a month for migraines.    HPI:  Mary Spencer is a 55 y.o. female here as requested by Dr. Mckinley Jewel for chronic migraines.  Past medical history of chronic migraines presenting with dizzy spells and vertigo,  chronic anxiety.  She has been managed by neurology in the past for her migraines.  She also has a history of thyroid disease.She has been to ENT multiple times and several of them, she has had extensive cardiac workup. 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 She can have headaches. Maxalt works well. Headaches are pulsating and pounding, light and sound sensitivity and nausea and vomiting.  She has daily vertigo and dizziness, it is just normal to her and she will set her hand on something and sits a lot, She has headaches which are minor 12 days a month and her main symptoms daily are vestibular. Nerve blocks helped in the past every 3 months occipital nerve blocks. She ses a 58. She had a neurologist in [address]. She sees Dr. Land chiropractor. Migraines can last 24-72 hours. She is on Topiramate and Alavil. No new symptoms or focal deficits, no hearing changes, no vision changes. She had had extended eeg, multiple MRIs. No other focal neurologic deficits, associated symptoms, inciting events or modifiable factors.   Meds tried: Elavil, Topiramate   Reviewed notes, labs and imaging from outside physicians, which showed:   Reviewed emergency room notes patient was seen in September 2019.  Patient moved to the area several months prior and had not been able to establish with  a PCP.  She is followed with a neurologist for chronic migraine in the past which presented as dizziness and vertigo type symptoms.  Also stated she gets injections to the base of her head which she has not been getting.  No changes in her headaches currently.  She is on amitriptyline, topiramate and BuSpar.  Vitals, labs and exam were normal in the emergency room.   Patient was seen at Dr. Latrelle Dodrill  office to establish care in October 2019.  She recently moved to this area from [place].  Has a history of chronic migraines presenting with dizzy spells and vertigo.  Also has a history of chronic anxiety.  Has seen neurology in the past.  Stated she had dizziness and headaches infrequently.  Physical exam and neurologic exam was normal.  No changes were made to her medications.   Will request notes from prior neurologist's office   REVIEW OF SYSTEMS: Out of a complete 14 system review of symptoms, the patient complains only of the following symptoms, dizziness, vision changes, headaches and all other reviewed systems are negative.   ALLERGIES: Allergies  Allergen Reactions   Penicillins Anaphylaxis   Erythromycin Hives   Keflex [Cephalexin] Hives   Sulfa Antibiotics Hives     HOME MEDICATIONS: Outpatient Medications Prior to Visit  Medication Sig Dispense Refill   acetaminophen (TYLENOL) 500 MG tablet Take 1,000 mg by mouth every 8 (eight) hours as needed for moderate pain or headache.     Alum Hydroxide-Mag Trisilicate (GAVISCON) 80-14.2 MG CHEW Chew 2 tablets by mouth 2 (two) times daily as needed (heartburn).     amitriptyline (ELAVIL) 75 MG tablet TAKE 1 TABLET AT BEDTIME 90 tablet 3   busPIRone (BUSPAR) 10 MG tablet Take 10 mg by mouth See admin instructions. Take 10 mg daily, may take a second 10 mg dose as needed for anxiety     calcium carbonate (TUMS - DOSED IN MG ELEMENTAL CALCIUM) 500 MG chewable tablet Chew 2-3 tablets by mouth 5 (five) times daily as needed for indigestion or heartburn. (Patient not taking: Reported on 01/03/2021)     omeprazole (PRILOSEC) 20 MG capsule Take 20 mg by mouth daily. (Patient not taking: Reported on 01/03/2021)     rizatriptan (MAXALT-MLT) 10 MG disintegrating tablet Take 1 tablet (10 mg total) by mouth as needed for migraine. May repeat in 2 hours if needed 27 tablet 4   topiramate (TOPAMAX) 100 MG tablet Take 1 tablet (100 mg total) by mouth at  bedtime. 90 tablet 4   No facility-administered medications prior to visit.     PAST MEDICAL HISTORY: Past Medical History:  Diagnosis Date   Ankle fracture 06/2019   right    Finger fracture 06/2019   Right hand, 5th finger   Migraines    Pneumonia    Thyroid disease      PAST SURGICAL HISTORY: Past Surgical History:  Procedure Laterality Date   ABDOMINAL HYSTERECTOMY     BUNIONECTOMY Left    CHOLECYSTECTOMY N/A 01/08/2020   Procedure: LAPAROSCOPIC CHOLECYSTECTOMY;  Surgeon: Andria Meuse, MD;  Location: [facility];  Service: General;  Laterality: N/A;   HERNIA REPAIR     TONSILLECTOMY       FAMILY HISTORY: Family History  Problem Relation Age of Onset   Stroke Mother    Seizures Mother    Cancer Mother    Macular degeneration Father    Migraines Sister        cluster   Stroke Maternal Grandmother  Seizures Maternal Grandmother    Cancer Maternal Grandfather    Migraines Sister        cluster     SOCIAL HISTORY: Social History   Socioeconomic History   Marital status: Married    Spouse name: Not on file   Number of children: 2   Years of education: Not on file   Highest education level: Bachelor's degree (e.g., BA, AB, BS)  Occupational History   Not on file  Tobacco Use   Smoking status: Every Day    Packs/day: 0.50    Types: Cigarettes    Last attempt to quit: 11/2018    Years since quitting: 2.5   Smokeless tobacco: Never  Vaping Use   Vaping Use: Never used  Substance and Sexual Activity   Alcohol use: Yes    Comment: "very rarely, maybe 1 drink on a holiday"   Drug use: Never   Sexual activity: Not on file  Other Topics Concern   Not on file  Social History Narrative   Lives at home with her husband    Right handed   Caffeine: 2-3 cups daily   Social Determinants of Health   Financial Resource Strain: Not on file  Food Insecurity: Not on file  Transportation Needs: Not on file  Physical Activity: Not on file  Stress: Not  on file  Social Connections: Not on file  Intimate Partner Violence: Not on file     PHYSICAL EXAM  There were no vitals filed for this visit. There is no height or weight on file to calculate BMI.  Generalized: Well developed, in no acute distress  Cardiology: normal rate and rhythm, no murmur auscultated  Respiratory: clear to auscultation bilaterally    Neurological examination  Mentation: Alert oriented to time, place, history taking. Follows all commands speech and language fluent Cranial nerve II-XII: Pupils were equal round reactive to light. Extraocular movements were full, visual field were full on confrontational test. Facial sensation and strength were normal. Uvula tongue midline. Head turning and shoulder shrug  were normal and symmetric. Motor: The motor testing reveals 5 over 5 strength of all 4 extremities. Good symmetric motor tone is noted throughout.  Sensory: Sensory testing is intact to soft touch on all 4 extremities. No evidence of extinction is noted.  Coordination: Cerebellar testing reveals good finger-nose-finger and heel-to-shin bilaterally.  Gait and station: Gait is normal. Tandem gait is normal. Romberg is negative. No drift is seen.  Reflexes: Deep tendon reflexes are symmetric and normal bilaterally.    DIAGNOSTIC DATA (LABS, IMAGING, TESTING) - I reviewed patient records, labs, notes, testing and imaging myself where available.  Lab Results  Component Value Date   WBC 5.4 01/04/2020   HGB 13.3 01/04/2020   HCT 40.7 01/04/2020   MCV 88.9 01/04/2020   PLT 284 01/04/2020      Component Value Date/Time   NA 145 (H) 11/16/2020 1149   K 4.7 11/16/2020 1149   CL 108 (H) 11/16/2020 1149   CO2 20 11/16/2020 1149   GLUCOSE 94 11/16/2020 1149   GLUCOSE 105 (H) 01/04/2020 1253   BUN 14 11/16/2020 1149   CREATININE 0.89 11/16/2020 1149   CALCIUM 9.3 11/16/2020 1149   PROT 7.3 01/04/2020 1253   ALBUMIN 4.2 01/04/2020 1253   AST 16 01/04/2020 1253    ALT 15 01/04/2020 1253   ALKPHOS 47 01/04/2020 1253   BILITOT 0.8 01/04/2020 1253   GFRNONAA >60 01/04/2020 1253   GFRAA >60 01/04/2020 1253  No results found for: CHOL, HDL, LDLCALC, LDLDIRECT, TRIG, CHOLHDL No results found for: BXID5W No results found for: VITAMINB12 No results found for: TSH  No flowsheet data found.   No flowsheet data found.   ASSESSMENT AND PLAN  55 y.o. year old female  has a past medical history of Ankle fracture (06/2019), Finger fracture (06/2019), Migraines, Pneumonia, and Thyroid disease. here with    No diagnosis found.   No orders of the defined types were placed in this encounter.    No orders of the defined types were placed in this encounter.     Shawnie Dapper, MSN, FNP-C 05/18/2021, 11:33 AM  [facility] [address], [address] [place], [place] [address] [phone]

## 2021-05-22 ENCOUNTER — Ambulatory Visit: Payer: BC Managed Care – PPO | Admitting: Family Medicine

## 2021-06-01 DIAGNOSIS — L989 Disorder of the skin and subcutaneous tissue, unspecified: Secondary | ICD-10-CM | POA: Diagnosis not present

## 2021-06-01 DIAGNOSIS — K529 Noninfective gastroenteritis and colitis, unspecified: Secondary | ICD-10-CM | POA: Diagnosis not present

## 2021-06-01 DIAGNOSIS — R109 Unspecified abdominal pain: Secondary | ICD-10-CM | POA: Diagnosis not present

## 2021-06-01 DIAGNOSIS — Z23 Encounter for immunization: Secondary | ICD-10-CM | POA: Diagnosis not present

## 2021-06-01 DIAGNOSIS — L299 Pruritus, unspecified: Secondary | ICD-10-CM | POA: Diagnosis not present

## 2021-07-06 DIAGNOSIS — L723 Sebaceous cyst: Secondary | ICD-10-CM | POA: Diagnosis not present

## 2021-08-30 ENCOUNTER — Emergency Department (HOSPITAL_BASED_OUTPATIENT_CLINIC_OR_DEPARTMENT_OTHER)
Admission: EM | Admit: 2021-08-30 | Discharge: 2021-08-30 | Disposition: A | Discharge status: Home / Self Care | Payer: BC Managed Care – PPO | Attending: Emergency Medicine | Admitting: Emergency Medicine

## 2021-08-30 ENCOUNTER — Emergency Department (HOSPITAL_BASED_OUTPATIENT_CLINIC_OR_DEPARTMENT_OTHER): Payer: BC Managed Care – PPO

## 2021-08-30 ENCOUNTER — Other Ambulatory Visit: Payer: Self-pay

## 2021-08-30 ENCOUNTER — Encounter (HOSPITAL_BASED_OUTPATIENT_CLINIC_OR_DEPARTMENT_OTHER): Payer: Self-pay | Admitting: *Deleted

## 2021-08-30 DIAGNOSIS — Z87891 Personal history of nicotine dependence: Secondary | ICD-10-CM | POA: Insufficient documentation

## 2021-08-30 DIAGNOSIS — R1032 Left lower quadrant pain: Secondary | ICD-10-CM | POA: Diagnosis not present

## 2021-08-30 DIAGNOSIS — K5792 Diverticulitis of intestine, part unspecified, without perforation or abscess without bleeding: Secondary | ICD-10-CM | POA: Diagnosis not present

## 2021-08-30 DIAGNOSIS — K5730 Diverticulosis of large intestine without perforation or abscess without bleeding: Secondary | ICD-10-CM | POA: Diagnosis not present

## 2021-08-30 HISTORY — DX: Diverticulitis of intestine, part unspecified, without perforation or abscess without bleeding: K57.92

## 2021-08-30 LAB — COMPREHENSIVE METABOLIC PANEL
ALT: 11 U/L (ref 0–44)
AST: 11 U/L — ABNORMAL LOW (ref 15–41)
Albumin: 4.2 g/dL (ref 3.5–5.0)
Alkaline Phosphatase: 66 U/L (ref 38–126)
Anion gap: 8 (ref 5–15)
BUN: 12 mg/dL (ref 6–20)
CO2: 25 mmol/L (ref 22–32)
Calcium: 9.3 mg/dL (ref 8.9–10.3)
Chloride: 107 mmol/L (ref 98–111)
Creatinine, Ser: 0.89 mg/dL (ref 0.44–1.00)
GFR, Estimated: >60 mL/min (ref >60)
Glucose, Bld: 104 mg/dL — ABNORMAL HIGH (ref 70–99)
Potassium: 3.8 mmol/L (ref 3.5–5.1)
Sodium: 140 mmol/L (ref 135–145)
Total Bilirubin: 0.5 mg/dL (ref 0.3–1.2)
Total Protein: 7.2 g/dL (ref 6.5–8.1)

## 2021-08-30 LAB — CBC
HCT: 39.8 % (ref 36.0–46.0)
Hemoglobin: 13 g/dL (ref 12.0–15.0)
MCH: 28.1 pg (ref 26.0–34.0)
MCHC: 32.7 g/dL (ref 30.0–36.0)
MCV: 86.1 fL (ref 80.0–100.0)
Platelets: 293 10*3/uL (ref 150–400)
RBC: 4.62 MIL/uL (ref 3.87–5.11)
RDW: 13 % (ref 11.5–15.5)
WBC: 5.7 10*3/uL (ref 4.0–10.5)
nRBC: 0 % (ref 0.0–0.2)

## 2021-08-30 LAB — LIPASE, BLOOD: Lipase: 21 U/L (ref 11–51)

## 2021-08-30 MED ORDER — IOHEXOL 300 MG/ML  SOLN
100.0000 mL | Freq: Once | INTRAMUSCULAR | Status: AC | PRN
  Administered 2021-08-30: 19:00:00 100 mL via INTRAVENOUS

## 2021-08-30 MED ORDER — SODIUM CHLORIDE 0.9 % IV BOLUS
1000.0000 mL | Freq: Once | INTRAVENOUS | Status: AC
  Administered 2021-08-30: 20:00:00 1000 mL via INTRAVENOUS

## 2021-08-30 MED ORDER — CIPROFLOXACIN HCL 500 MG PO TABS: 500.0000 mg | ORAL_TABLET | Freq: Two times a day (BID) | ORAL | 0 refills | Status: AC

## 2021-08-30 MED ORDER — OXYCODONE HCL 5 MG PO TABS: 5.0000 mg | ORAL_TABLET | ORAL | 0 refills | Status: DC | PRN

## 2021-08-30 MED ORDER — METRONIDAZOLE 500 MG PO TABS: 500.0000 mg | ORAL_TABLET | Freq: Two times a day (BID) | ORAL | 0 refills | Status: DC

## 2021-08-30 MED ORDER — METRONIDAZOLE 500 MG PO TABS
500.0000 mg | ORAL_TABLET | Freq: Once | ORAL | Status: AC
  Administered 2021-08-30: 21:00:00 500 mg via ORAL
  Filled 2021-08-30: qty 1

## 2021-08-30 MED ORDER — CIPROFLOXACIN HCL 500 MG PO TABS
500.0000 mg | ORAL_TABLET | Freq: Once | ORAL | Status: AC
  Administered 2021-08-30: 21:00:00 500 mg via ORAL
  Filled 2021-08-30: qty 1

## 2021-08-30 MED ORDER — ONDANSETRON 4 MG PO TBDP: 4.0000 mg | ORAL_TABLET | Freq: Three times a day (TID) | ORAL | 0 refills | Status: DC | PRN

## 2021-08-30 MED ORDER — ONDANSETRON HCL 4 MG/2ML IJ SOLN
4.0000 mg | Freq: Once | INTRAMUSCULAR | Status: DC
  Filled 2021-08-30: qty 2

## 2021-08-30 MED ORDER — MORPHINE SULFATE (PF) 4 MG/ML IV SOLN
4.0000 mg | Freq: Once | INTRAVENOUS | Status: DC
  Filled 2021-08-30: qty 1

## 2021-08-30 NOTE — Discharge Instructions (Addendum)
It was our pleasure taking care of you here in the emergency department today  As discussed in the room your CT scan showed diverticulitis.  You were started on antibiotics.  Take as prescribed.  We have given your first dose here in the emergency department  Also written you for a short course of some pain medicine.  Please use caution as this medication is an opioid.  It does have addictive potential.  Do not drive or operate heavy machinery while taking this medication.  I have also written for some Zofran which is a nausea medicine.  Return for new or worsening symptoms.

## 2021-08-30 NOTE — ED Triage Notes (Signed)
History of Diverticulitis, 3 days ago developed same type of pain. No N/V/D

## 2021-08-30 NOTE — ED Provider Notes (Signed)
[facility] Provider Note   CSN: [id number] Arrival date & time: 08/30/21  1500     History Chief Complaint  Patient presents with   Abdominal Pain    Mary Spencer is a 55 y.o. female with no significant past medical history who presents for evaluation of left lower quadrant abdominal pain.  Feels similar to her prior episodes of diverticulitis.  Has never been hospitalized for this previously.  Has chronically loose stools since prior cholecystectomy.  No melena or blood per rectum.  She is passing flatus.  Has had some nausea without vomiting.  No fever, chest pain, shortness of breath, back pain.  No urinary symptoms.  Her pain a 5/10.  Denies additional aggravating or relieving factors.  History obtained from patient and past medical records.  No interpreter is used.  HPI     Past Medical History:  Diagnosis Date   Ankle fracture 06/2019   right    Diverticulitis    Finger fracture 06/2019   Right hand, 5th finger   Migraines    Pneumonia    Thyroid disease     Patient Active Problem List   Diagnosis Date Noted   Chronic migraine without aura without status migrainosus, not intractable 10/01/2018   Intractable migraine with aura with status migrainosus 10/01/2018   Vestibular migraine 10/01/2018    Past Surgical History:  Procedure Laterality Date   ABDOMINAL HYSTERECTOMY     BUNIONECTOMY Left    CHOLECYSTECTOMY N/A 01/08/2020   Procedure: LAPAROSCOPIC CHOLECYSTECTOMY;  Surgeon: Ileana Roup, MD;  Location: [facility];  Service: General;  Laterality: N/A;   HERNIA REPAIR     TONSILLECTOMY       OB History   No obstetric history on file.     Family History  Problem Relation Age of Onset   Stroke Mother    Seizures Mother    Cancer Mother    Macular degeneration Father    Migraines Sister        cluster   Stroke Maternal Grandmother    Seizures Maternal Grandmother    Cancer Maternal Grandfather    Migraines  Sister        cluster    Social History   Tobacco Use   Smoking status: Former    Packs/day: 0.50    Types: Cigarettes    Quit date: 11/2018    Years since quitting: 2.8   Smokeless tobacco: Never  Vaping Use   Vaping Use: Never used  Substance Use Topics   Alcohol use: Yes    Comment: "very rarely, maybe 1 drink on a holiday"   Drug use: Never    Home Medications Prior to Admission medications   Medication Sig Start Date End Date Taking? Authorizing Provider  ciprofloxacin (CIPRO) 500 MG tablet Take 1 tablet (500 mg total) by mouth every 12 (twelve) hours for 7 days. 08/30/21 09/06/21 Yes Mry Lamia A, PA-C  metroNIDAZOLE (FLAGYL) 500 MG tablet Take 1 tablet (500 mg total) by mouth 2 (two) times daily. 08/30/21  Yes Bernabe Dorce A, PA-C  ondansetron (ZOFRAN-ODT) 4 MG disintegrating tablet Take 1 tablet (4 mg total) by mouth every 8 (eight) hours as needed for nausea or vomiting. 08/30/21  Yes Roshan Roback A, PA-C  oxyCODONE (ROXICODONE) 5 MG immediate release tablet Take 1 tablet (5 mg total) by mouth every 4 (four) hours as needed for severe pain. 08/30/21  Yes Asia Favata A, PA-C  acetaminophen (TYLENOL) 500 MG tablet Take 1,000  mg by mouth every 8 (eight) hours as needed for moderate pain or headache.    [provider]  Alum Hydroxide-Mag Trisilicate (GAVISCON) A999333 MG CHEW Chew 2 tablets by mouth 2 (two) times daily as needed (heartburn).    [provider]  amitriptyline (ELAVIL) 75 MG tablet TAKE 1 TABLET AT BEDTIME 01/30/21   Melvenia Beam, MD  busPIRone (BUSPAR) 10 MG tablet Take 10 mg by mouth See admin instructions. Take 10 mg daily, may take a second 10 mg dose as needed for anxiety    [provider]  calcium carbonate (TUMS - DOSED IN MG ELEMENTAL CALCIUM) 500 MG chewable tablet Chew 2-3 tablets by mouth 5 (five) times daily as needed for indigestion or heartburn. Patient not taking: Reported on 01/03/2021    [provider]  omeprazole (PRILOSEC) 20 MG capsule Take 20 mg by mouth daily. Patient not taking: Reported on 01/03/2021    [provider]  rizatriptan (MAXALT-MLT) 10 MG disintegrating tablet Take 1 tablet (10 mg total) by mouth as needed for migraine. May repeat in 2 hours if needed 11/16/19   Melvenia Beam, MD  topiramate (TOPAMAX) 100 MG tablet Take 1 tablet (100 mg total) by mouth at bedtime. 01/18/21   Melvenia Beam, MD    Allergies    Penicillins, Erythromycin, Keflex [cephalexin], and Sulfa antibiotics  Review of Systems   Review of Systems  Constitutional: Negative.   HENT: Negative.    Respiratory: Negative.    Cardiovascular: Negative.   Gastrointestinal:  Positive for abdominal pain and nausea. Negative for abdominal distention, anal bleeding, blood in stool, constipation, diarrhea, rectal pain and vomiting.  Genitourinary: Negative.   Musculoskeletal: Negative.   Skin: Negative.   Neurological: Negative.   All other systems reviewed and are negative.  Physical Exam Updated Vital Signs BP 132/79 (BP Location: Left Arm)    Pulse 81    Temp 98.2 F (36.8 C)    Resp 16    Ht 5\' 6"  (1.676 m)    Wt 90.7 kg    SpO2 99%    BMI 32.28 kg/m   Physical Exam Vitals and nursing note reviewed.  Constitutional:      General: She is not in acute distress.    Appearance: She is well-developed. She is not ill-appearing.  HENT:     Head: Normocephalic and atraumatic.  Eyes:     Pupils: Pupils are equal, round, and reactive to light.  Cardiovascular:     Rate and Rhythm: Normal rate.     Heart sounds: Normal heart sounds.  Pulmonary:     Effort: Pulmonary effort is normal. No respiratory distress.     Breath sounds: Normal breath sounds.  Abdominal:     General: Bowel sounds are normal. There is no distension.     Palpations: Abdomen is soft.     Tenderness: There is abdominal tenderness in the suprapubic area and left lower quadrant. There is guarding. There is no  right CVA tenderness, left CVA tenderness or rebound. Negative signs include Murphy's sign and McBurney's sign.     Hernia: No hernia is present.  Musculoskeletal:        General: Normal range of motion.     Cervical back: Normal range of motion.  Skin:    General: Skin is warm and dry.  Neurological:     General: No focal deficit present.     Mental Status: She is alert.  Psychiatric:  Mood and Affect: Mood normal.   ED Results / Procedures / Treatments   Labs (all labs ordered are listed, but only abnormal results are displayed) Labs Reviewed  COMPREHENSIVE METABOLIC PANEL - Abnormal; Notable for the following components:      Result Value   Glucose, Bld 104 (*)    AST 11 (*)    All other components within normal limits  LIPASE, BLOOD  CBC  URINALYSIS, ROUTINE W REFLEX MICROSCOPIC    EKG None  Radiology CT Abdomen Pelvis W Contrast  Result Date: 08/30/2021 CLINICAL DATA:  Pain left lower quadrant EXAM: CT ABDOMEN AND PELVIS WITH CONTRAST TECHNIQUE: Multidetector CT imaging of the abdomen and pelvis was performed using the standard protocol following bolus administration of intravenous contrast. CONTRAST:  167mL OMNIPAQUE IOHEXOL 300 MG/ML  SOLN COMPARISON:  None. FINDINGS: Lower chest: Unremarkable. Hepatobiliary: Liver measures 16.4 cm in length. There is no dilation of bile ducts. Surgical clips are seen in gallbladder fossa. Pancreas: No focal abnormality is seen. Spleen: Spleen measures 12.6 cm in maximum diameter. Adrenals/Urinary Tract: Adrenals are unremarkable. There is no hydronephrosis. There are no renal or ureteral stones. Urinary bladder is not distended. Stomach/Bowel: Stomach is unremarkable. Small bowel loops are not dilated. Cecum is higher than usual in position. Normal-appearing appendix is noted in the subhepatic location. There is mild wall thickening in the hepatic flexure. Scattered diverticula are seen in the colon. There is mild stranding in the fat  planes adjacent to the junction of descending and sigmoid colon in the left lower quadrant. There is no loculated pericolic fluid collection. Vascular/Lymphatic: Vascular structures are unremarkable. No significant lymphadenopathy seen. Reproductive: Uterus is not seen. Other: There is no ascites or pneumoperitoneum. Small umbilical hernia containing fat is seen. There is possible small left inguinal hernia containing fat. Musculoskeletal: Unremarkable. IMPRESSION: There is no evidence of intestinal obstruction or pneumoperitoneum. There is no hydronephrosis. Appendix is not dilated. Diverticulosis of colon. There is mild pericolic stranding at the junction of descending and sigmoid colon suggesting mild acute or chronic diverticulitis. There is no loculated pericolic abscess. There is mild diffuse wall thickening in the hepatic flexure, possibly due to incomplete distention. Less likely possibility would be nonspecific colitis. Other findings as described in the body of the report. Electronically Signed   By: Elmer Picker M.D.   On: 08/30/2021 19:48    Procedures Procedures   Medications Ordered in ED Medications  ondansetron [facility]) injection 4 mg (0 mg Intravenous Hold 08/30/21 1938)  morphine 4 MG/ML injection 4 mg (0 mg Intravenous Hold 08/30/21 1939)  ciprofloxacin (CIPRO) tablet 500 mg (has no administration in time range)  metroNIDAZOLE (FLAGYL) tablet 500 mg (has no administration in time range)  sodium chloride 0.9 % bolus 1,000 mL (1,000 mLs Intravenous New Bag/Given 08/30/21 1931)  iohexol (OMNIPAQUE) 300 MG/ML solution 100 mL (100 mLs Intravenous Contrast Given 08/30/21 1909)    ED Course  I have reviewed the triage vital signs and the nursing notes.  Pertinent labs & imaging results that were available during my care of the patient were reviewed by me and considered in my medical decision making (see chart for details).  Pleasant 55 year old here for evaluation of left lower  quadrant pain over the last 3 days.  She is afebrile, nonseptic, not ill-appearing.  No changes in her bowel movements, has chronically loose stool, no melena or blood per rectum.  Some nausea the vomiting.  Her heart and lungs are clear.  She has diffuse tenderness  to lower abdomen, worse to left lower quadrant with some guarding.  Suspect diverticulitis.  Labs and imaging personally reviewed and interpreted:  CBC without leukocytosis  Metabolic panel without electrolyte, renal or liver normality Lipase 21 CT AP with diverticulitis  Patient reassessed.  Has not needed anything for pain or nausea.  She is tolerating p.o. intake.  Discussed results and imaging.  Given first dose of antibiotics here.  Discussed close follow-up outpatient.  She is agreeable.  Return for new or worsening symptoms.  The patient has been appropriately medically screened and/or stabilized in the ED. I have low suspicion for any other emergent medical condition which would require further screening, evaluation or treatment in the ED or require inpatient management.  Patient is hemodynamically stable and in no acute distress.  Patient able to ambulate in department prior to ED.  Evaluation does not show acute pathology that would require ongoing or additional emergent interventions while in the emergency department or further inpatient treatment.  I have discussed the diagnosis with the patient and answered all questions.  Pain is been managed while in the emergency department and patient has no further complaints prior to discharge.  Patient is comfortable with plan discussed in room and is stable for discharge at this time.  I have discussed strict return precautions for returning to the emergency department.  Patient was encouraged to follow-up with PCP/specialist refer to at discharge.     MDM Rules/Calculators/A&P                             Final Clinical Impression(s) / ED Diagnoses Final diagnoses:   Diverticulitis    Rx / DC Orders ED Discharge Orders          Ordered    ciprofloxacin (CIPRO) 500 MG tablet  Every 12 hours        08/30/21 2013    metroNIDAZOLE (FLAGYL) 500 MG tablet  2 times daily        08/30/21 2013    ondansetron (ZOFRAN-ODT) 4 MG disintegrating tablet  Every 8 hours PRN        08/30/21 2013    oxyCODONE (ROXICODONE) 5 MG immediate release tablet  Every 4 hours PRN        08/30/21 2013             Marylan Glore A, PA-C 08/30/21 2026    Benjiman Core, MD 08/31/21 0013

## 2021-09-12 NOTE — Patient Instructions (Signed)
Below is our plan:  We will topiramate 100mg  daily and amitriptyline 75mg  daily. Continue rizatriptan as needed. Please take 1 tablet at onset of headache. May take 1 additional tablet in 2 hours if needed. Do not take more than 2 tablets in 24 hours or more than 10 in a month. You can use Tylenol and ibuprofen as well for really bad headaches.    Please make sure you are staying well hydrated. I recommend 50-60 ounces daily. Well balanced diet and regular exercise encouraged. Consistent sleep schedule with 6-8 hours recommended.   Please continue follow up with care team as directed.   Follow up with me in 1 year  You may receive a survey regarding today's visit. I encourage you to leave honest feed back as I do use this information to improve patient care. Thank you for seeing me today!

## 2021-09-12 NOTE — Progress Notes (Signed)
Chief Complaint  Patient presents with   Follow-up    Rm 1, alone. Here for migraine f/u. Pt reports migraines have been well. Has changed job and stress is low. Pt reports more vertigo migraines and not much as painful.      HISTORY OF PRESENT ILLNESS:  09/13/21 ALL:  Mary Spencer is a 56 y.o. female here today for follow up for migraines. She discontinued Ajovy at last visit with Dr Jaynee Eagles and continued amitriptyline 75mg  and topiramate 100mg  daily. MRI was normal. She was referred to vestibular therapy. She went for an evaluation and unable to return. She was given home exercises. She reports migraines have significantly improved. Stress levels are much better since switching employment. She has occasional bouts of vertigo when she turns or moves her head quickly. Her husband is recovering from sepsis from an obstructed kidney stone. She has had a few more headaches over the past 2 weeks but feels it is related to not sleeping and stress. Rizatriptan usually works well for abortive therapy.   HISTORY (copied from Dr Cathren Laine previous note)  11/16/2020: The Ajovy made it worse 3 months, more frequent mild headaches. Aimovig only did it once.  Stopping the ajovy made it go away. She still has dizziness stable. She is having diplopia, side by side, occasionally randomly, she is going to talk to the optometrist, she is due for new glasses, started 3-4 months ago, no head trauma or new medications, she feels tired and stressed all the time and her job is extremely stressful. She started smoking again, she is trying to quit, it is getting more frequent, closing one eye helps. She still have the vestibular migraines. No headaches, just daily vertigo/dizziness, room spinning also feeling like being on the ocean but can have frank room spinning. Chronic daily continuous. Diplopia is binocular.    Patient complains of symptoms per HPI as well as the following symptoms: vertigo, stress . Pertinent  negatives and positives per HPI. All others negative   Interval history 11/16/2019: Could not get the Aimovig. We will try Ajovy, gave her 3 months samples in the meantime we will try and get it approved. She is stable, she has a lot of dizziness and vestibular symptomsshe is having more headaches, more stress at work, daily vertigo and dizziness, daily headaches and symptoms even if mild, tylenol helps with motrin and that usually helps, she has maxalt and that helps, she has to take maxalt 8 days a month for migraines.    HPI:  Mary Spencer is a 56 y.o. female here as requested by Dr. Collene Mares for chronic migraines.  Past medical history of chronic migraines presenting with dizzy spells and vertigo, chronic anxiety.  She has been managed by neurology in the past for her migraines.  She also has a history of thyroid disease.She has been to ENT multiple times and several of them, she has had extensive cardiac workup. 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 She can have headaches. Maxalt works well. Headaches are pulsating and pounding, light and sound sensitivity and nausea and vomiting.  She has daily vertigo and dizziness, it is just normal to her and she will set her hand on something and sits a lot, She has headaches which are minor  12 days a month and her main symptoms daily are vestibular. Nerve blocks helped in the past every 3 months occipital nerve blocks. She ses a Restaurant manager, fast food. She had a neurologist in [place]. She sees Dr. Birdena Crandall chiropractor. Migraines can last 24-72 hours. She is on Topiramate and Alavil. No new symptoms or focal deficits, no hearing changes, no vision changes. She had had extended eeg, multiple MRIs. No other focal neurologic deficits, associated  symptoms, inciting events or modifiable factors.   Meds tried: Elavil, Topiramate   Reviewed notes, labs and imaging from outside physicians, which showed:   Reviewed emergency room notes patient was seen in September 2019.  Patient moved to the area several months prior and had not been able to establish with a PCP.  She is followed with a neurologist for chronic migraine in the past which presented as dizziness and vertigo type symptoms.  Also stated she gets injections to the base of her head which she has not been getting.  No changes in her headaches currently.  She is on amitriptyline, topiramate and BuSpar.  Vitals, labs and exam were normal in the emergency room.   Patient was seen at Dr. Barry Brunner office to establish care in October 2019.  She recently moved to this area from [place].  Has a history of chronic migraines presenting with dizzy spells and vertigo.  Also has a history of chronic anxiety.  Has seen neurology in the past.  Stated she had dizziness and headaches infrequently.  Physical exam and neurologic exam was normal.  No changes were made to her medications.   Will request notes from prior neurologist's office   REVIEW OF SYSTEMS: Out of a complete 14 system review of symptoms, the patient complains only of the following symptoms, headaches and all other reviewed systems are negative.   ALLERGIES: Allergies  Allergen Reactions   Penicillins Anaphylaxis   Erythromycin Hives   Keflex [Cephalexin] Hives   Sulfa Antibiotics Hives     HOME MEDICATIONS: Outpatient Medications Prior to Visit  Medication Sig Dispense Refill   acetaminophen (TYLENOL) 500 MG tablet Take 1,000 mg by mouth every 8 (eight) hours as needed for moderate pain or headache.     Alum Hydroxide-Mag Trisilicate (GAVISCON) [id number] MG CHEW Chew 2 tablets by mouth 2 (two) times daily as needed (heartburn).     busPIRone (BUSPAR) 10 MG tablet Take 10 mg by mouth See admin instructions. Take 10 mg  daily, may take a second 10 mg dose as needed for anxiety     calcium carbonate (TUMS - DOSED IN MG ELEMENTAL CALCIUM) 500 MG chewable tablet Chew 2-3 tablets by mouth 5 (five) times daily as needed for indigestion or heartburn.     omeprazole (PRILOSEC) 20 MG capsule Take 20 mg by mouth daily.     amitriptyline (ELAVIL) 75 MG tablet TAKE 1 TABLET AT BEDTIME 90 tablet 3   rizatriptan (MAXALT-MLT) 10 MG disintegrating tablet Take 1 tablet (10 mg total) by mouth as needed for migraine. May repeat in 2 hours if needed 27 tablet 4   topiramate (TOPAMAX) 100 MG tablet Take 1 tablet (100 mg total) by mouth at bedtime. 90 tablet 4   metroNIDAZOLE (FLAGYL) 500 MG tablet Take 1 tablet (500 mg total) by mouth 2 (two) times daily. 14 tablet 0   ondansetron (ZOFRAN-ODT) 4 MG disintegrating tablet Take 1 tablet (4 mg total) by mouth every 8 (eight) hours as needed for nausea or vomiting. 20 tablet 0   oxyCODONE (ROXICODONE) 5  MG immediate release tablet Take 1 tablet (5 mg total) by mouth every 4 (four) hours as needed for severe pain. 30 tablet 0   No facility-administered medications prior to visit.     PAST MEDICAL HISTORY: Past Medical History:  Diagnosis Date   Ankle fracture 06/2019   right    Diverticulitis    Finger fracture 06/2019   Right hand, 5th finger   Migraines    Pneumonia    Thyroid disease      PAST SURGICAL HISTORY: Past Surgical History:  Procedure Laterality Date   ABDOMINAL HYSTERECTOMY     BUNIONECTOMY Left    CHOLECYSTECTOMY N/A 01/08/2020   Procedure: LAPAROSCOPIC CHOLECYSTECTOMY;  Surgeon: Ileana Roup, MD;  Location: [facility];  Service: General;  Laterality: N/A;   HERNIA REPAIR     TONSILLECTOMY       FAMILY HISTORY: Family History  Problem Relation Age of Onset   Stroke Mother    Seizures Mother    Cancer Mother    Macular degeneration Father    Migraines Sister        cluster   Stroke Maternal Grandmother    Seizures Maternal Grandmother     Cancer Maternal Grandfather    Migraines Sister        cluster     SOCIAL HISTORY: Social History   Socioeconomic History   Marital status: Married    Spouse name: Not on file   Number of children: 2   Years of education: Not on file   Highest education level: Bachelor's degree (e.g., BA, AB, BS)  Occupational History   Not on file  Tobacco Use   Smoking status: Former    Packs/day: 0.50    Types: Cigarettes    Quit date: 11/2018    Years since quitting: 2.8   Smokeless tobacco: Never  Vaping Use   Vaping Use: Never used  Substance and Sexual Activity   Alcohol use: Yes    Comment: "very rarely, maybe 1 drink on a holiday"   Drug use: Never   Sexual activity: Not on file  Other Topics Concern   Not on file  Social History Narrative   Lives at home with her husband    Right handed   Caffeine: 2-3 cups daily   Social Determinants of Health   Financial Resource Strain: Not on file  Food Insecurity: Not on file  Transportation Needs: Not on file  Physical Activity: Not on file  Stress: Not on file  Social Connections: Not on file  Intimate Partner Violence: Not on file     PHYSICAL EXAM  Vitals:   09/13/21 0746  BP: 113/79  Pulse: 88  Weight: 213 lb (96.6 kg)  Height: 5\' 6"  (1.676 m)   Body mass index is 34.38 kg/m.  Generalized: Well developed, in no acute distress  Cardiology: normal rate and rhythm, no murmur auscultated  Respiratory: clear to auscultation bilaterally    Neurological examination  Mentation: Alert oriented to time, place, history taking. Follows all commands speech and language fluent Cranial nerve II-XII: Pupils were equal round reactive to light. Extraocular movements were full, visual field were full on confrontational test. Facial sensation and strength were normal. Uvula tongue midline. Head turning and shoulder shrug  were normal and symmetric. Motor: The motor testing reveals 5 over 5 strength of all 4 extremities. Good  symmetric motor tone is noted throughout.  Gait and station: Gait is normal.    DIAGNOSTIC DATA (LABS, IMAGING, TESTING) -  I reviewed patient records, labs, notes, testing and imaging myself where available.  Lab Results  Component Value Date   WBC 5.7 08/30/2021   HGB 13.0 08/30/2021   HCT 39.8 08/30/2021   MCV 86.1 08/30/2021   PLT 293 08/30/2021      Component Value Date/Time   NA 140 08/30/2021 1527   NA 145 (H) 11/16/2020 1149   K 3.8 08/30/2021 1527   CL 107 08/30/2021 1527   CO2 25 08/30/2021 1527   GLUCOSE 104 (H) 08/30/2021 1527   BUN 12 08/30/2021 1527   BUN 14 11/16/2020 1149   CREATININE 0.89 08/30/2021 1527   CALCIUM 9.3 08/30/2021 1527   PROT 7.2 08/30/2021 1527   ALBUMIN 4.2 08/30/2021 1527   AST 11 (L) 08/30/2021 1527   ALT 11 08/30/2021 1527   ALKPHOS 66 08/30/2021 1527   BILITOT 0.5 08/30/2021 1527   GFRNONAA >60 08/30/2021 1527   GFRAA >60 01/04/2020 1253   No results found for: CHOL, HDL, LDLCALC, LDLDIRECT, TRIG, CHOLHDL No results found for: HGBA1C No results found for: VITAMINB12 No results found for: TSH  No flowsheet data found.   No flowsheet data found.   ASSESSMENT AND PLAN  56 y.o. year old female  has a past medical history of Ankle fracture (06/2019), Diverticulitis, Finger fracture (06/2019), Migraines, Pneumonia, and Thyroid disease. here with    Chronic migraine without aura without status migrainosus, not intractable - Plan: amitriptyline (ELAVIL) 75 MG tablet, topiramate (TOPAMAX) 100 MG tablet  [place] is doing well. Migraines are well managed on topiramate 100mg  and amitriptyline 75mg  daily. We will continue current treatment plan. She will continue rizatriptan for abortive therapy. Healthy lifestyle habits advised. She will follow up in 1 year, sooner if needed.   No orders of the defined types were placed in this encounter.    Meds ordered this encounter  Medications   rizatriptan (MAXALT-MLT) 10 MG disintegrating  tablet    Sig: Take 1 tablet (10 mg total) by mouth as needed for migraine. May repeat in 2 hours if needed    Dispense:  27 tablet    Refill:  3    This is a 90-day supply do not fill early   amitriptyline (ELAVIL) 75 MG tablet    Sig: Take 1 tablet (75 mg total) by mouth at bedtime.    Dispense:  90 tablet    Refill:  3    Order Specific Question:   Supervising Provider    Answer:   Melvenia Beam [id number]   topiramate (TOPAMAX) 100 MG tablet    Sig: Take 1 tablet (100 mg total) by mouth at bedtime.    Dispense:  90 tablet    Refill:  3    Order Specific Question:   Supervising Provider    Answer:   Melvenia Beam [id number]      Debbora Presto, MSN, FNP-C 09/13/2021, 8:44 AM  [facility] [address], [place], [place] [address] [phone]

## 2021-09-13 ENCOUNTER — Encounter: Payer: Self-pay | Admitting: Family Medicine

## 2021-09-13 ENCOUNTER — Ambulatory Visit: Payer: BC Managed Care – PPO | Admitting: Family Medicine

## 2021-09-13 VITALS — BP 113/79 | HR 88 | Ht 66.0 in | Wt 213.0 lb

## 2021-09-13 DIAGNOSIS — G43709 Chronic migraine without aura, not intractable, without status migrainosus: Secondary | ICD-10-CM

## 2021-09-13 MED ORDER — AMITRIPTYLINE HCL 75 MG PO TABS: 75.0000 mg | ORAL_TABLET | Freq: Every day | ORAL | 3 refills | Status: DC

## 2021-09-13 MED ORDER — TOPIRAMATE 100 MG PO TABS: 100.0000 mg | ORAL_TABLET | Freq: Every day | ORAL | 3 refills | Status: DC

## 2021-09-13 MED ORDER — RIZATRIPTAN BENZOATE 10 MG PO TBDP: 10.0000 mg | ORAL_TABLET | ORAL | 3 refills | Status: DC | PRN

## 2022-01-17 DIAGNOSIS — Z713 Dietary counseling and surveillance: Secondary | ICD-10-CM | POA: Diagnosis not present

## 2022-01-31 DIAGNOSIS — Z0189 Encounter for other specified special examinations: Secondary | ICD-10-CM | POA: Diagnosis not present

## 2022-01-31 DIAGNOSIS — E063 Autoimmune thyroiditis: Secondary | ICD-10-CM | POA: Diagnosis not present

## 2022-02-01 IMAGING — US US ABDOMEN COMPLETE
1 series · 14 of 25 positions shown · non-contrast
Comparison: None.

CLINICAL DATA: Upper abdominal pain for 8 months.

EXAM:
ABDOMEN ULTRASOUND COMPLETE

[Series 1: us abdomen complete · 0.17mm/px · 14 of 157 slices shown]
[im 1/157]
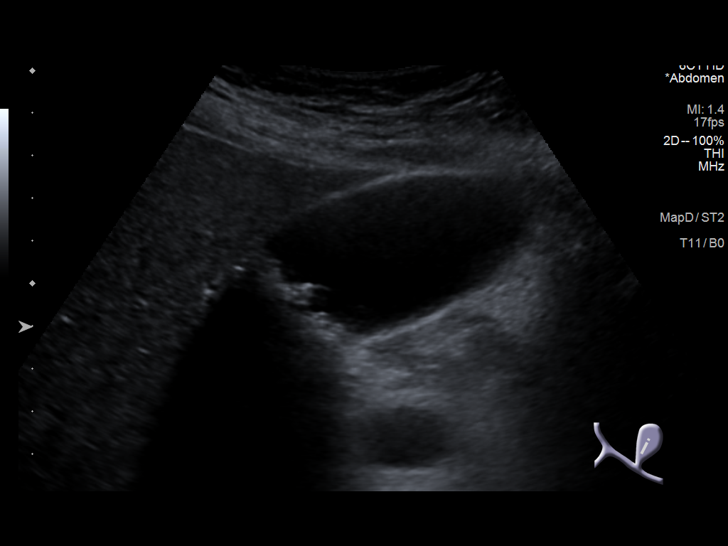
[im 14/157]
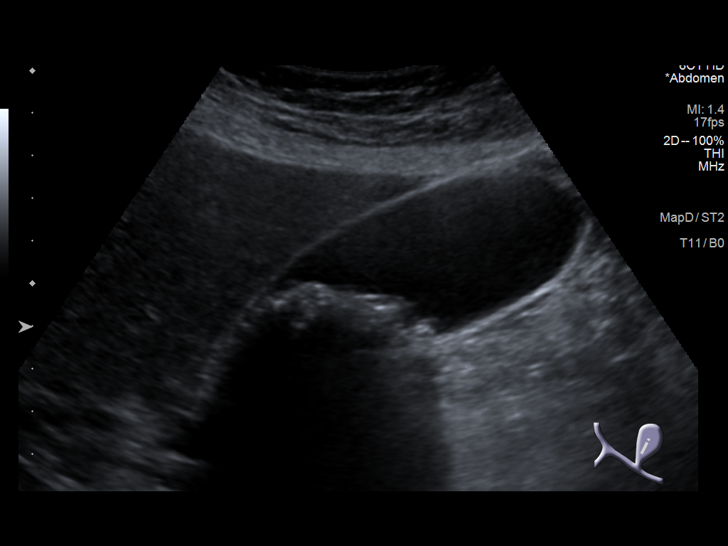
[im 27/157]
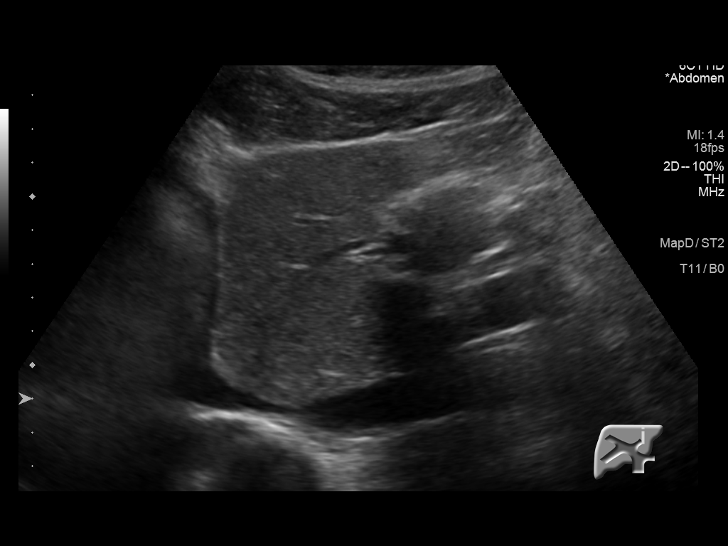
[im 40/157]
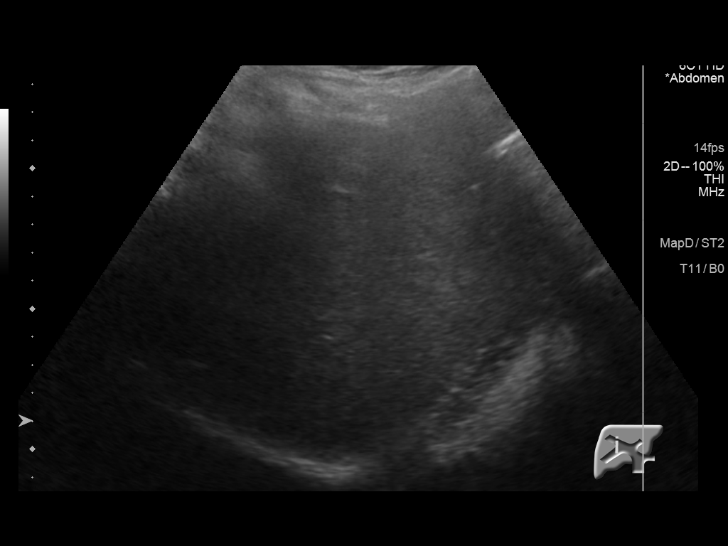
[im 53/157]
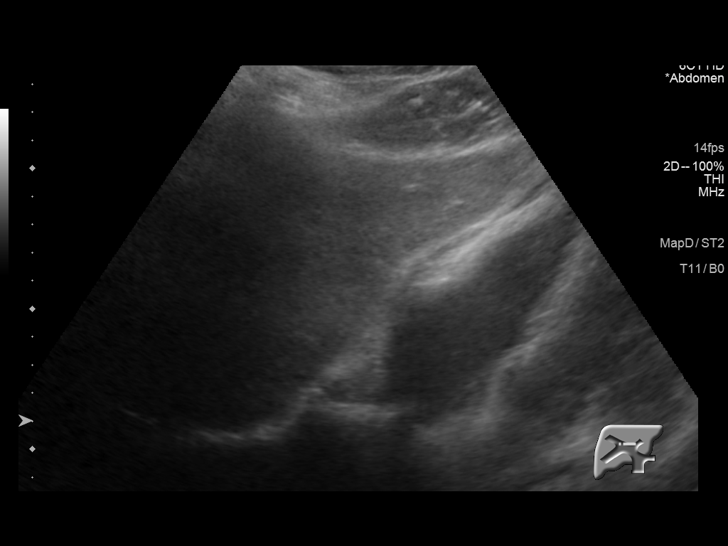
[im 59/157]
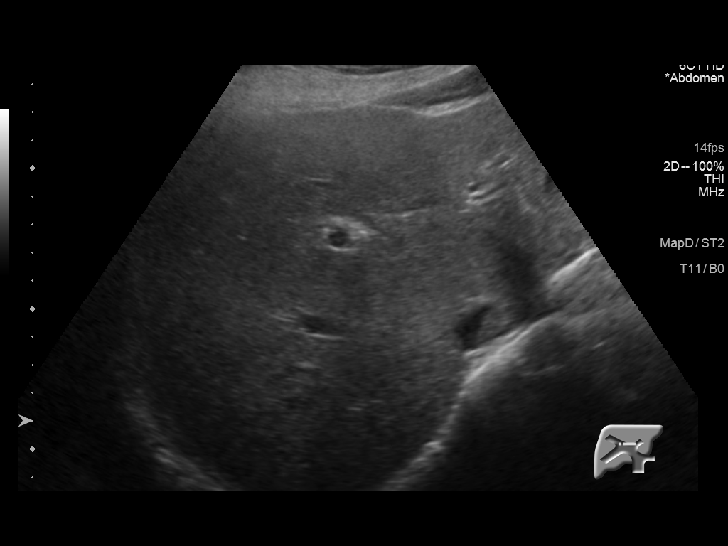
[im 72/157]
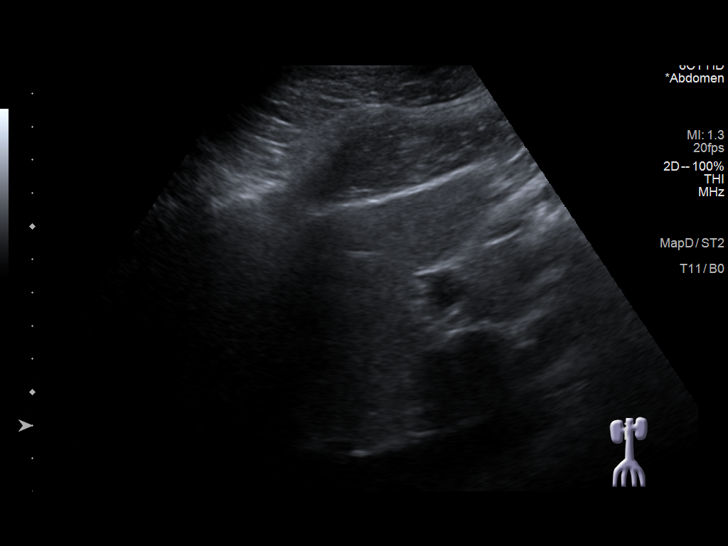
[im 85/157]
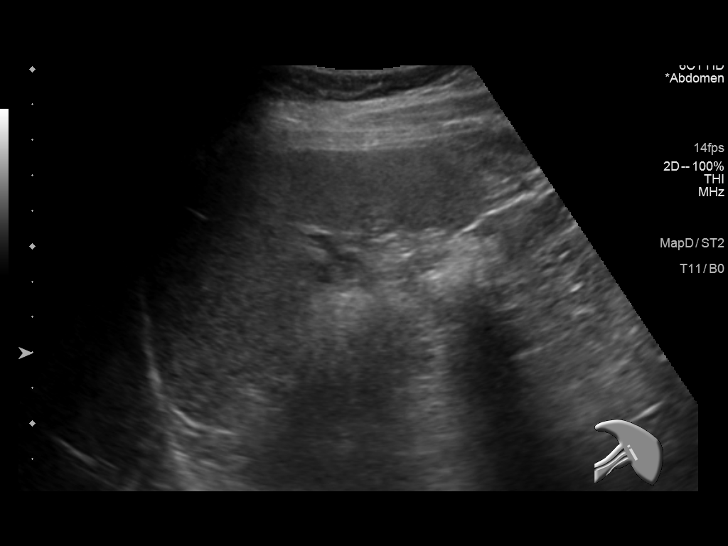
[im 98/157]
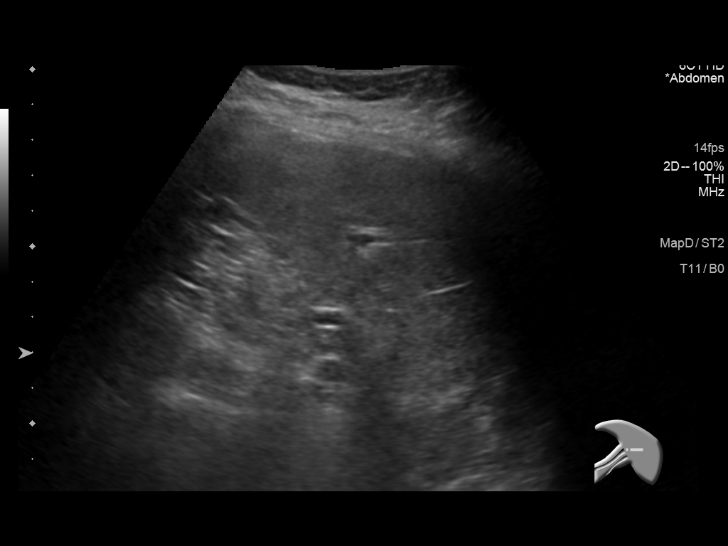
[im 105/157]
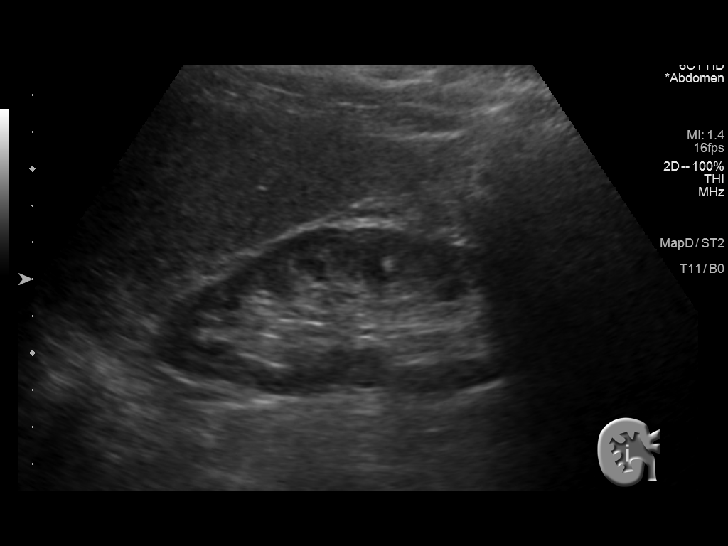
[im 118/157]
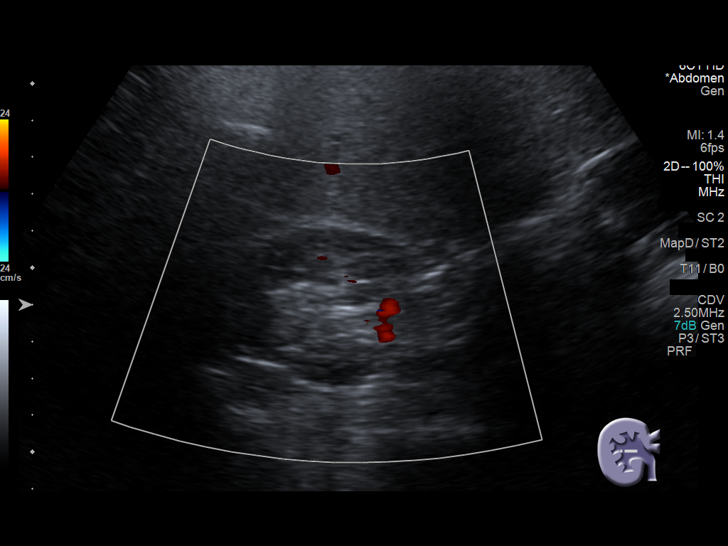
[im 131/157]
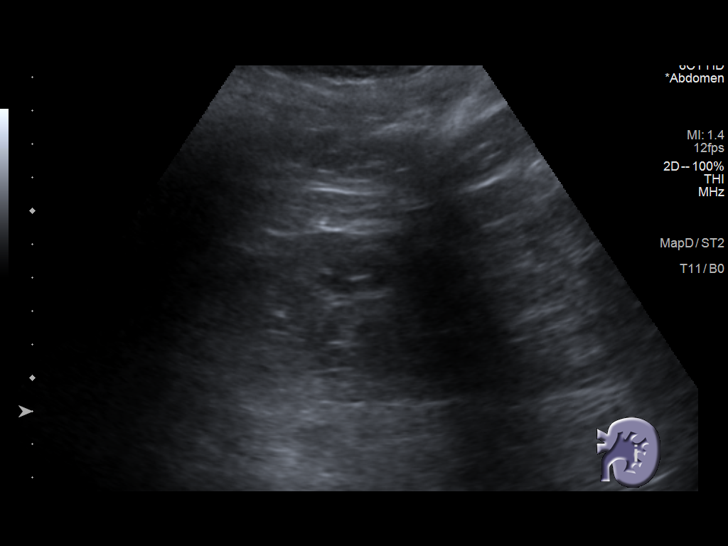
[im 144/157]
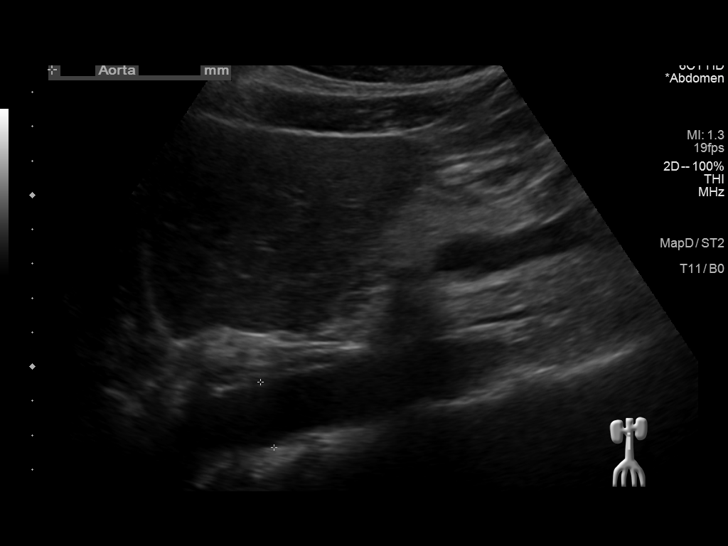
[im 157/157]
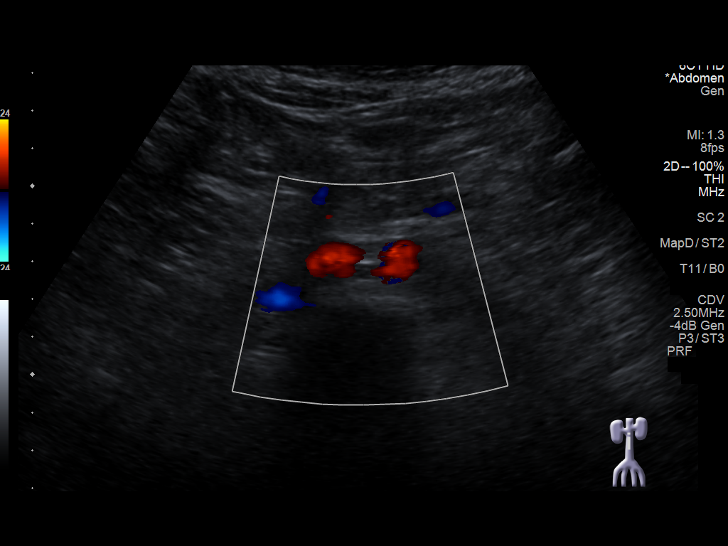

[14 of 25 positions shown; findings below may reference images not displayed]

FINDINGS: Gallbladder: Innumerable tiny stones measuring up to approximately
0.6 cm in diameter are seen within the gallbladder. There is no
gallbladder wall thickening or pericholecystic fluid. Sonographer
reports negative Murphy's sign.

Common bile duct: Diameter: 0.5 cm

Liver: No focal lesion. The liver appears somewhat dense with
increased echogenicity. Portal vein is patent on color Doppler
imaging with normal direction of blood flow towards the liver.

IVC: No abnormality visualized.

Pancreas: Visualized portion unremarkable.

Spleen: Size and appearance within normal limits.

Right Kidney: Length: 10.4 cm. Echogenicity within normal limits. No
mass or hydronephrosis visualized.

Left Kidney: Length: 10.4 cm. Echogenicity within normal limits. No
mass or hydronephrosis visualized.

Abdominal aorta: No aneurysm visualized.

Other findings: None.
IMPRESSION: Gallstones without evidence.  Of cholecystitis

Fatty infiltration liver.

## 2022-03-09 ENCOUNTER — Emergency Department (HOSPITAL_BASED_OUTPATIENT_CLINIC_OR_DEPARTMENT_OTHER): Payer: BC Managed Care – PPO

## 2022-03-09 ENCOUNTER — Emergency Department (HOSPITAL_BASED_OUTPATIENT_CLINIC_OR_DEPARTMENT_OTHER)
Admission: EM | Admit: 2022-03-09 | Discharge: 2022-03-09 | Disposition: A | Discharge status: Home / Self Care | Payer: BC Managed Care – PPO | Attending: Emergency Medicine | Admitting: Emergency Medicine

## 2022-03-09 ENCOUNTER — Encounter (HOSPITAL_BASED_OUTPATIENT_CLINIC_OR_DEPARTMENT_OTHER): Payer: Self-pay

## 2022-03-09 ENCOUNTER — Other Ambulatory Visit: Payer: Self-pay

## 2022-03-09 DIAGNOSIS — R0789 Other chest pain: Secondary | ICD-10-CM | POA: Insufficient documentation

## 2022-03-09 DIAGNOSIS — Z87891 Personal history of nicotine dependence: Secondary | ICD-10-CM | POA: Insufficient documentation

## 2022-03-09 DIAGNOSIS — J9811 Atelectasis: Secondary | ICD-10-CM | POA: Diagnosis not present

## 2022-03-09 LAB — CBC WITH DIFFERENTIAL/PLATELET
Abs Immature Granulocytes: 0.01 10*3/uL (ref 0.00–0.07)
Basophils Absolute: 0.1 10*3/uL (ref 0.0–0.1)
Basophils Relative: 1 %
Eosinophils Absolute: 0 10*3/uL (ref 0.0–0.5)
Eosinophils Relative: 0 %
HCT: 39.9 % (ref 36.0–46.0)
Hemoglobin: 12.9 g/dL (ref 12.0–15.0)
Immature Granulocytes: 0 %
Lymphocytes Relative: 43 %
Lymphs Abs: 2.3 10*3/uL (ref 0.7–4.0)
MCH: 27.7 pg (ref 26.0–34.0)
MCHC: 32.3 g/dL (ref 30.0–36.0)
MCV: 85.8 fL (ref 80.0–100.0)
Monocytes Absolute: 0.3 10*3/uL (ref 0.1–1.0)
Monocytes Relative: 5 %
Neutro Abs: 2.8 10*3/uL (ref 1.7–7.7)
Neutrophils Relative %: 51 %
Platelets: 286 10*3/uL (ref 150–400)
RBC: 4.65 MIL/uL (ref 3.87–5.11)
RDW: 13.2 % (ref 11.5–15.5)
WBC: 5.4 10*3/uL (ref 4.0–10.5)
nRBC: 0 % (ref 0.0–0.2)

## 2022-03-09 LAB — BASIC METABOLIC PANEL
Anion gap: 10 (ref 5–15)
BUN: 10 mg/dL (ref 6–20)
CO2: 25 mmol/L (ref 22–32)
Calcium: 9.4 mg/dL (ref 8.9–10.3)
Chloride: 107 mmol/L (ref 98–111)
Creatinine, Ser: 0.89 mg/dL (ref 0.44–1.00)
GFR, Estimated: >60 mL/min (ref >60)
Glucose, Bld: 98 mg/dL (ref 70–99)
Potassium: 3.8 mmol/L (ref 3.5–5.1)
Sodium: 142 mmol/L (ref 135–145)

## 2022-03-09 LAB — TROPONIN I (HIGH SENSITIVITY)
Troponin I (High Sensitivity): 2 ng/L (ref <18)
Troponin I (High Sensitivity): <2 ng/L (ref <18)

## 2022-03-09 LAB — D-DIMER, QUANTITATIVE: D-Dimer, Quant: 0.32 ug/mL-FEU (ref 0.00–0.50)

## 2022-03-09 MED ORDER — NITROGLYCERIN 0.4 MG SL SUBL
0.4000 mg | SUBLINGUAL_TABLET | Freq: Once | SUBLINGUAL | Status: AC
  Administered 2022-03-09 (×3): 0.4 mg via SUBLINGUAL
  Filled 2022-03-09: qty 1

## 2022-03-09 MED ORDER — ALUM & MAG HYDROXIDE-SIMETH 200-200-20 MG/5ML PO SUSP
30.0000 mL | Freq: Once | ORAL | Status: AC
  Administered 2022-03-09: 30 mL via ORAL
  Filled 2022-03-09: qty 30

## 2022-03-09 NOTE — ED Notes (Signed)
States pain is better but still feel sore in chest which is worse with deep breathing

## 2022-03-09 NOTE — ED Provider Notes (Signed)
[facility] EMERGENCY DEPT Provider Note   CSN: [id number] Arrival date & time: 03/09/22  3976     History  Chief Complaint  Patient presents with   Chest Pain    Mary Spencer is a 56 y.o. female.  Patient with chest pain for the last 3 hours.  Worse when she takes a deep breath in.  Denies any numbness, weakness, shortness of breath, recent surgery or travel.  She did work with some vinegar solution yesterday and thought maybe inhaling that might of caused some discomfort today.  Denies any significant family history of cardiac disease.  History of migraines but otherwise no significant medical history.  Nothing makes it better.  Breathing makes it worse.  Movement makes it worse.  Denies any headache, numbness, weakness, chills, cough, sputum production.  Former smoker.  The history is provided by the patient.       Home Medications Prior to Admission medications   Medication Sig Start Date End Date Taking? Authorizing Provider  acetaminophen (TYLENOL) 500 MG tablet Take 1,000 mg by mouth every 8 (eight) hours as needed for moderate pain or headache.    [provider]  Alum Hydroxide-Mag Trisilicate (GAVISCON) 80-14.2 MG CHEW Chew 2 tablets by mouth 2 (two) times daily as needed (heartburn).    [provider]  amitriptyline (ELAVIL) 75 MG tablet Take 1 tablet (75 mg total) by mouth at bedtime. 09/13/21   Lomax, Amy, NP  busPIRone (BUSPAR) 10 MG tablet Take 10 mg by mouth See admin instructions. Take 10 mg daily, may take a second 10 mg dose as needed for anxiety    [provider]  calcium carbonate (TUMS - DOSED IN MG ELEMENTAL CALCIUM) 500 MG chewable tablet Chew 2-3 tablets by mouth 5 (five) times daily as needed for indigestion or heartburn.    [provider]  omeprazole (PRILOSEC) 20 MG capsule Take 20 mg by mouth daily.    [provider]  rizatriptan (MAXALT-MLT) 10 MG disintegrating tablet Take 1 tablet (10  mg total) by mouth as needed for migraine. May repeat in 2 hours if needed 09/13/21   Lomax, Amy, NP  topiramate (TOPAMAX) 100 MG tablet Take 1 tablet (100 mg total) by mouth at bedtime. 09/13/21   Lomax, Amy, NP      Allergies    Penicillins, Erythromycin, Keflex [cephalexin], and Sulfa antibiotics    Review of Systems   Review of Systems  Physical Exam Updated Vital Signs BP 114/68   Pulse 72   Temp 98.2 F (36.8 C) (Oral)   Resp 15   Ht 5\' 6"  (1.676 m)   Wt 97.5 kg   SpO2 100%   BMI 34.70 kg/m  Physical Exam Vitals and nursing note reviewed.  Constitutional:      General: She is not in acute distress.    Appearance: She is well-developed. She is not ill-appearing.  HENT:     Head: Normocephalic and atraumatic.  Eyes:     Extraocular Movements: Extraocular movements intact.     Conjunctiva/sclera: Conjunctivae normal.     Pupils: Pupils are equal, round, and reactive to light.  Cardiovascular:     Rate and Rhythm: Normal rate and regular rhythm.     Pulses:          Radial pulses are 2+ on the right side and 2+ on the left side.     Heart sounds: Normal heart sounds. No murmur heard. Pulmonary:     Effort: Pulmonary effort  is normal. No respiratory distress.     Breath sounds: Normal breath sounds. No decreased breath sounds or wheezing.  Abdominal:     Palpations: Abdomen is soft.     Tenderness: There is no abdominal tenderness.  Musculoskeletal:        General: No swelling.     Cervical back: Normal range of motion and neck supple.     Right lower leg: No edema.     Left lower leg: No edema.  Skin:    General: Skin is warm and dry.     Capillary Refill: Capillary refill takes less than 2 seconds.  Neurological:     General: No focal deficit present.     Mental Status: She is alert.  Psychiatric:        Mood and Affect: Mood normal.     ED Results / Procedures / Treatments   Labs (all labs ordered are listed, but only abnormal results are  displayed) Labs Reviewed  CBC WITH DIFFERENTIAL/PLATELET  BASIC METABOLIC PANEL  D-DIMER, QUANTITATIVE  TROPONIN I (HIGH SENSITIVITY)  TROPONIN I (HIGH SENSITIVITY)    EKG EKG Interpretation  Date/Time:  Friday March 09 2022 09:34:17 EDT Ventricular Rate:  76 PR Interval:  132 QRS Duration: 95 QT Interval:  412 QTC Calculation: 464 R Axis:   35 Text Interpretation: Sinus rhythm No significant change since last tracing Confirmed by Virgina Norfolk ([id number]) on 03/09/2022 9:37:34 AM  Radiology DG Chest Portable 1 View  Result Date: 03/09/2022 CLINICAL DATA:  Onset chest pain this morning, central chest pain radiating to neck and LEFT breast, chest pressure, hurts to breathe EXAM: PORTABLE CHEST 1 VIEW COMPARISON:  Portable exam 0955 hours without priors for comparison FINDINGS: Normal heart size, mediastinal contours, and pulmonary vascularity. Minimal bibasilar atelectasis. Lungs otherwise clear. No acute infiltrate, pleural effusion, or pneumothorax. Osseous structures unremarkable. IMPRESSION: Minimal bibasilar atelectasis. Electronically Signed   By: Ulyses Southward M.D.   On: 03/09/2022 10:06    Procedures Procedures    Medications Ordered in ED Medications  nitroGLYCERIN (NITROSTAT) SL tablet 0.4 mg (0.4 mg Sublingual Given 03/09/22 1002)  alum & mag hydroxide-simeth (MAALOX/MYLANTA) 200-200-20 MG/5ML suspension 30 mL (30 mLs Oral Given 03/09/22 1136)    ED Course/ Medical Decision Making/ A&P                           Medical Decision Making Amount and/or Complexity of Data Reviewed Labs: ordered. Radiology: ordered.  Risk OTC drugs. Prescription drug management.   Mary Spencer is here with chest pain.  Normal vitals.  No fever.  Heart score 1, will check troponin.  Differential diagnosis is ACS versus PE versus pneumonia versus MSK versus reflux versus bronchitis.  Will check D-dimer, CBC, BMP, troponin, chest x-ray.  EKG per my review and interpretation shows sinus  rhythm.  No ischemic changes.  Per my review and interpretation of labs, troponin normal x2.  D-dimer negative.  No significant electrolyte abnormality, kidney injury, leukocytosis.  Chest x-ray no signs of pneumonia or pneumothorax per my review and interpretation.  Overall atypical sounding chest pain.  Low heart score.  We will have her follow-up with primary care doctor.  Discharged in good condition.  Understands return precautions.  This chart was dictated using voice recognition software.  Despite best efforts to proofread,  errors can occur which can change the documentation meaning.         Final Clinical Impression(s) / ED  Diagnoses Final diagnoses:  Atypical chest pain    Rx / DC Orders ED Discharge Orders     None         Virgina Norfolk, DO 03/09/22 1152

## 2022-03-09 NOTE — ED Triage Notes (Signed)
Onset this am chest pain  Central chest radiating into neck and into left breast.  States feels like a lot pressure in chest and hurst to breath

## 2022-03-09 NOTE — Discharge Instructions (Signed)
Cardiac work-up is unremarkable.  Follow-up with her primary care doctor.

## 2022-03-09 NOTE — ED Notes (Signed)
States pain has lessen ans moved more to left chest after nitro SL given

## 2022-03-22 DIAGNOSIS — Z713 Dietary counseling and surveillance: Secondary | ICD-10-CM | POA: Diagnosis not present

## 2022-07-12 DIAGNOSIS — Z1231 Encounter for screening mammogram for malignant neoplasm of breast: Secondary | ICD-10-CM | POA: Diagnosis not present

## 2022-09-12 NOTE — Progress Notes (Unsigned)
No chief complaint on file.    HISTORY OF PRESENT ILLNESS:  09/12/22 ALL:  Mary Spencer returns for follow up for migraines. She continues amitriptyline 75mg  and topiramate 100mg  QHS. Rizatriptan used for abortive therapy.   09/13/2021 ALL: Mary Spencer is a 57 y.o. female here today for follow up for migraines. She discontinued Ajovy at last visit with Dr Jaynee Eagles and continued amitriptyline 75mg  and topiramate 100mg  daily. MRI was normal. She was referred to vestibular therapy. She went for an evaluation and unable to return. She was given home exercises. She reports migraines have significantly improved. Stress levels are much better since switching employment. She has occasional bouts of vertigo when she turns or moves her head quickly. Her husband is recovering from sepsis from an obstructed kidney stone. She has had a few more headaches over the past 2 weeks but feels it is related to not sleeping and stress. Rizatriptan usually works well for abortive therapy.   HISTORY (copied from Dr Cathren Laine previous note)  11/16/2020: The Ajovy made it worse 3 months, more frequent mild headaches. Aimovig only did it once.  Stopping the ajovy made it go away. She still has dizziness stable. She is having diplopia, side by side, occasionally randomly, she is going to talk to the optometrist, she is due for new glasses, started 3-4 months ago, no head trauma or new medications, she feels tired and stressed all the time and her job is extremely stressful. She started smoking again, she is trying to quit, it is getting more frequent, closing one eye helps. She still have the vestibular migraines. No headaches, just daily vertigo/dizziness, room spinning also feeling like being on the ocean but can have frank room spinning. Chronic daily continuous. Diplopia is binocular.    Patient complains of symptoms per HPI as well as the following symptoms: vertigo, stress . Pertinent negatives and positives per HPI.  All others negative   Interval history 11/16/2019: Could not get the Aimovig. We will try Ajovy, gave her 3 months samples in the meantime we will try and get it approved. She is stable, she has a lot of dizziness and vestibular symptomsshe is having more headaches, more stress at work, daily vertigo and dizziness, daily headaches and symptoms even if mild, tylenol helps with motrin and that usually helps, she has maxalt and that helps, she has to take maxalt 8 days a month for migraines.    HPI:  Mary Spencer is a 57 y.o. female here as requested by Dr. Collene Mares for chronic migraines.  Past medical history of chronic migraines presenting with dizzy spells and vertigo, chronic anxiety.  She has been managed by neurology in the past for her migraines.  She also has a history of thyroid disease.She has been to ENT multiple times and several of them, she has had extensive cardiac workup. 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 She can have headaches. Maxalt works well. Headaches are pulsating and pounding, light and sound sensitivity and nausea and vomiting.  She has daily vertigo and dizziness, it is just normal to her and she will set her hand on something and sits a lot, She has headaches which are minor 12 days a month and her main symptoms daily are vestibular. Nerve blocks helped in  the past every 3 months occipital nerve blocks. She ses a Restaurant manager, fast food. She had a neurologist in [place]. She sees Dr. Birdena Crandall chiropractor. Migraines can last 24-72 hours. She is on Topiramate and Alavil. No new symptoms or focal deficits, no hearing changes, no vision changes. She had had extended eeg, multiple MRIs. No other focal neurologic deficits, associated symptoms, inciting events or modifiable  factors.   Meds tried: Elavil, Topiramate   Reviewed notes, labs and imaging from outside physicians, which showed:   Reviewed emergency room notes patient was seen in September 2019.  Patient moved to the area several months prior and had not been able to establish with a PCP.  She is followed with a neurologist for chronic migraine in the past which presented as dizziness and vertigo type symptoms.  Also stated she gets injections to the base of her head which she has not been getting.  No changes in her headaches currently.  She is on amitriptyline, topiramate and BuSpar.  Vitals, labs and exam were normal in the emergency room.   Patient was seen at Dr. Barry Brunner office to establish care in October 2019.  She recently moved to this area from [place].  Has a history of chronic migraines presenting with dizzy spells and vertigo.  Also has a history of chronic anxiety.  Has seen neurology in the past.  Stated she had dizziness and headaches infrequently.  Physical exam and neurologic exam was normal.  No changes were made to her medications.   Will request notes from prior neurologist's office   REVIEW OF SYSTEMS: Out of a complete 14 system review of symptoms, the patient complains only of the following symptoms, headaches and all other reviewed systems are negative.   ALLERGIES: Allergies  Allergen Reactions   Penicillins Anaphylaxis   Erythromycin Hives   Keflex [Cephalexin] Hives   Sulfa Antibiotics Hives     HOME MEDICATIONS: Outpatient Medications Prior to Visit  Medication Sig Dispense Refill   acetaminophen (TYLENOL) 500 MG tablet Take 1,000 mg by mouth every 8 (eight) hours as needed for moderate pain or headache.     Alum Hydroxide-Mag Trisilicate (GAVISCON) 56-21.3 MG CHEW Chew 2 tablets by mouth 2 (two) times daily as needed (heartburn).     amitriptyline (ELAVIL) 75 MG tablet Take 1 tablet (75 mg total) by mouth at bedtime. 90 tablet 3   busPIRone (BUSPAR) 10 MG tablet  Take 10 mg by mouth See admin instructions. Take 10 mg daily, may take a second 10 mg dose as needed for anxiety     calcium carbonate (TUMS - DOSED IN MG ELEMENTAL CALCIUM) 500 MG chewable tablet Chew 2-3 tablets by mouth 5 (five) times daily as needed for indigestion or heartburn.     omeprazole (PRILOSEC) 20 MG capsule Take 20 mg by mouth daily.     rizatriptan (MAXALT-MLT) 10 MG disintegrating tablet Take 1 tablet (10 mg total) by mouth as needed for migraine. May repeat in 2 hours if needed 27 tablet 3   topiramate (TOPAMAX) 100 MG tablet Take 1 tablet (100 mg total) by mouth at bedtime. 90 tablet 3   No facility-administered medications prior to visit.     PAST MEDICAL HISTORY: Past Medical History:  Diagnosis Date   Ankle fracture 06/2019   right    Diverticulitis    Finger fracture 06/2019   Right hand, 5th finger   Migraines    Pneumonia    Thyroid disease      PAST SURGICAL HISTORY: Past  Surgical History:  Procedure Laterality Date   ABDOMINAL HYSTERECTOMY     BUNIONECTOMY Left    CHOLECYSTECTOMY N/A 01/08/2020   Procedure: LAPAROSCOPIC CHOLECYSTECTOMY;  Surgeon: Andria Meuse, MD;  Location: [facility];  Service: General;  Laterality: N/A;   HERNIA REPAIR     TONSILLECTOMY       FAMILY HISTORY: Family History  Problem Relation Age of Onset   Stroke Mother    Seizures Mother    Cancer Mother    Macular degeneration Father    Migraines Sister        cluster   Stroke Maternal Grandmother    Seizures Maternal Grandmother    Cancer Maternal Grandfather    Migraines Sister        cluster     SOCIAL HISTORY: Social History   Socioeconomic History   Marital status: Married    Spouse name: Not on file   Number of children: 2   Years of education: Not on file   Highest education level: Bachelor's degree (e.g., BA, AB, BS)  Occupational History   Not on file  Tobacco Use   Smoking status: Former    Packs/day: 0.50    Types: Cigarettes    Quit date:  11/2018    Years since quitting: 3.8   Smokeless tobacco: Never  Vaping Use   Vaping Use: Never used  Substance and Sexual Activity   Alcohol use: Not Currently    Comment: "very rarely, maybe 1 drink on a holiday"   Drug use: Never   Sexual activity: Not on file  Other Topics Concern   Not on file  Social History Narrative   Lives at home with her husband    Right handed   Caffeine: 2-3 cups daily   Social Determinants of Health   Financial Resource Strain: Not on file  Food Insecurity: Not on file  Transportation Needs: Not on file  Physical Activity: Not on file  Stress: Not on file  Social Connections: Not on file  Intimate Partner Violence: Not on file     PHYSICAL EXAM  There were no vitals filed for this visit.  There is no height or weight on file to calculate BMI.  Generalized: Well developed, in no acute distress  Cardiology: normal rate and rhythm, no murmur auscultated  Respiratory: clear to auscultation bilaterally    Neurological examination  Mentation: Alert oriented to time, place, history taking. Follows all commands speech and language fluent Cranial nerve II-XII: Pupils were equal round reactive to light. Extraocular movements were full, visual field were full on confrontational test. Facial sensation and strength were normal. Uvula tongue midline. Head turning and shoulder shrug  were normal and symmetric. Motor: The motor testing reveals 5 over 5 strength of all 4 extremities. Good symmetric motor tone is noted throughout.  Gait and station: Gait is normal.    DIAGNOSTIC DATA (LABS, IMAGING, TESTING) - I reviewed patient records, labs, notes, testing and imaging myself where available.  Lab Results  Component Value Date   WBC 5.4 03/09/2022   HGB 12.9 03/09/2022   HCT 39.9 03/09/2022   MCV 85.8 03/09/2022   PLT 286 03/09/2022      Component Value Date/Time   NA 142 03/09/2022 0942   NA 145 (H) 11/16/2020 1149   K 3.8 03/09/2022 0942    CL 107 03/09/2022 0942   CO2 25 03/09/2022 0942   GLUCOSE 98 03/09/2022 0942   BUN 10 03/09/2022 0942   BUN 14 11/16/2020  1149   CREATININE 0.89 03/09/2022 0942   CALCIUM 9.4 03/09/2022 0942   PROT 7.2 08/30/2021 1527   ALBUMIN 4.2 08/30/2021 1527   AST 11 (L) 08/30/2021 1527   ALT 11 08/30/2021 1527   ALKPHOS 66 08/30/2021 1527   BILITOT 0.5 08/30/2021 1527   GFRNONAA >60 03/09/2022 0942   GFRAA >60 01/04/2020 1253   No results found for: "CHOL", "HDL", "LDLCALC", "LDLDIRECT", "TRIG", "CHOLHDL" No results found for: "HGBA1C" No results found for: "VITAMINB12" No results found for: "TSH"      No data to display               No data to display           ASSESSMENT AND PLAN  57 y.o. year old female  has a past medical history of Ankle fracture (06/2019), Diverticulitis, Finger fracture (06/2019), Migraines, Pneumonia, and Thyroid disease. here with    No diagnosis found.  Smt. is doing well. Migraines are well managed on topiramate 100mg  and amitriptyline 75mg  daily. We will continue current treatment plan. She will continue rizatriptan for abortive therapy. Healthy lifestyle habits advised. She will follow up in 1 year, sooner if needed.   No orders of the defined types were placed in this encounter.    No orders of the defined types were placed in this encounter.     , MSN, FNP-C 09/12/2022, 2:34 PM  [facility] [address], [address] [place], [address] [place] [phone]

## 2022-09-12 NOTE — Patient Instructions (Signed)
Below is our plan:  We will continue amitriptyline and topiramate daily. Continue rizatriptan as needed.   Please make sure you are staying well hydrated. I recommend 50-60 ounces daily. Well balanced diet and regular exercise encouraged. Consistent sleep schedule with 6-8 hours recommended.   Please continue follow up with care team as directed.   Follow up with me in 1 year   You may receive a survey regarding today's visit. I encourage you to leave honest feed back as I do use this information to improve patient care. Thank you for seeing me today!

## 2022-09-13 ENCOUNTER — Encounter: Payer: Self-pay | Admitting: Family Medicine

## 2022-09-13 ENCOUNTER — Ambulatory Visit: Payer: BC Managed Care – PPO | Admitting: Family Medicine

## 2022-09-13 VITALS — BP 128/84 | HR 90 | Ht 66.0 in | Wt 211.0 lb

## 2022-09-13 DIAGNOSIS — G43709 Chronic migraine without aura, not intractable, without status migrainosus: Secondary | ICD-10-CM

## 2022-09-13 MED ORDER — TOPIRAMATE 100 MG PO TABS: 100.0000 mg | ORAL_TABLET | Freq: Every day | ORAL | 3 refills | Status: DC

## 2022-09-13 MED ORDER — AMITRIPTYLINE HCL 75 MG PO TABS: 75.0000 mg | ORAL_TABLET | Freq: Every day | ORAL | 3 refills | Status: DC

## 2022-09-13 MED ORDER — RIZATRIPTAN BENZOATE 10 MG PO TBDP: 10.0000 mg | ORAL_TABLET | ORAL | 3 refills | Status: DC | PRN

## 2023-01-18 DIAGNOSIS — F411 Generalized anxiety disorder: Secondary | ICD-10-CM | POA: Diagnosis not present

## 2023-01-18 DIAGNOSIS — H60542 Acute eczematoid otitis externa, left ear: Secondary | ICD-10-CM | POA: Diagnosis not present

## 2023-01-18 DIAGNOSIS — F430 Acute stress reaction: Secondary | ICD-10-CM | POA: Diagnosis not present

## 2023-01-18 DIAGNOSIS — R600 Localized edema: Secondary | ICD-10-CM | POA: Diagnosis not present

## 2023-07-25 DIAGNOSIS — Z1231 Encounter for screening mammogram for malignant neoplasm of breast: Secondary | ICD-10-CM | POA: Diagnosis not present

## 2023-07-25 DIAGNOSIS — R92333 Mammographic heterogeneous density, bilateral breasts: Secondary | ICD-10-CM | POA: Diagnosis not present

## 2023-08-12 DIAGNOSIS — F411 Generalized anxiety disorder: Secondary | ICD-10-CM | POA: Diagnosis not present

## 2023-08-12 DIAGNOSIS — L720 Epidermal cyst: Secondary | ICD-10-CM | POA: Diagnosis not present

## 2023-08-12 DIAGNOSIS — F430 Acute stress reaction: Secondary | ICD-10-CM | POA: Diagnosis not present

## 2023-08-12 DIAGNOSIS — G43709 Chronic migraine without aura, not intractable, without status migrainosus: Secondary | ICD-10-CM | POA: Diagnosis not present

## 2023-08-12 DIAGNOSIS — R197 Diarrhea, unspecified: Secondary | ICD-10-CM | POA: Diagnosis not present

## 2023-09-16 ENCOUNTER — Telehealth: Payer: Self-pay | Admitting: Family Medicine

## 2023-09-16 NOTE — Telephone Encounter (Signed)
 Noted.

## 2023-09-16 NOTE — Progress Notes (Signed)
 PATIENT: Mary Spencer DOB: 03/19/1966  REASON FOR VISIT: follow up HISTORY FROM: patient  Virtual Visit via Telephone Note  I connected with Mary Spencer on 09/17/23 at  8:30 AM EST by telephone and verified that I am speaking with the correct person using two identifiers.   I discussed the limitations, risks, security and privacy concerns of performing an evaluation and management service by telephone and the availability of in person appointments. I also discussed with the patient that there may be a patient responsible charge related to this service. The patient expressed understanding and agreed to proceed.   History of Present Illness:  09/16/23 ALL:  Summar returns for migraines. She was last seen 09/2022 and doing well on topiramate , amitriptyline  and rizatriptan . Since, she reports migraines remain well managed. She may have a handful of migraines per year. She has tension style headaches about 4-5 times a month. OTC analgesics usually abort tension headache. Rizatriptan  works well to abort migraines. She does have significant dizziness with migraines.   09/13/2022 ALL:  [name] returns for follow up for migraines. She continues amitriptyline  75mg  and topiramate  100mg  QHS. Rizatriptan  used for abortive therapy. She is doing very well. She may have 1-2 migrainous headaches per month. Rizatriptan  works well to abort. She reports dizziness waxes and wanes. She is working on healthy lifestyle habits. She drinks multiple cups of coffee. She admits she could drink more water. She is exercising more. She is followed by PCP regularly.   09/13/2021 ALL: Mary Spencer is a 58 y.o. female here today for follow up for migraines. She discontinued Ajovy  at last visit with Dr Ines and continued amitriptyline  75mg  and topiramate  100mg  daily. MRI was normal. She was referred to vestibular therapy. She went for an evaluation and unable to return. She was given home exercises. She  reports migraines have significantly improved. Stress levels are much better since switching employment. She has occasional bouts of vertigo when she turns or moves her head quickly. Her husband is recovering from sepsis from an obstructed kidney stone. She has had a few more headaches over the past 2 weeks but feels it is related to not sleeping and stress. Rizatriptan  usually works well for abortive therapy.   HISTORY (copied from Dr Sharion previous note)  11/16/2020: The Ajovy  made it worse 3 months, more frequent mild headaches. Aimovig  only did it once.  Stopping the ajovy  made it go away. She still has dizziness stable. She is having diplopia, side by side, occasionally randomly, she is going to talk to the optometrist, she is due for new glasses, started 3-4 months ago, no head trauma or new medications, she feels tired and stressed all the time and her job is extremely stressful. She started smoking again, she is trying to quit, it is getting more frequent, closing one eye helps. She still have the vestibular migraines. No headaches, just daily vertigo/dizziness, room spinning also feeling like being on the ocean but can have frank room spinning. Chronic daily continuous. Diplopia is binocular.    Patient complains of symptoms per HPI as well as the following symptoms: vertigo, stress . Pertinent negatives and positives per HPI. All others negative   Interval history 11/16/2019: Could not get the Aimovig . We will try Ajovy , gave her 3 months samples in the meantime we will try and get it approved. She is stable, she has a lot of dizziness and vestibular symptomsshe is having more headaches, more stress at work, daily vertigo and dizziness,  daily headaches and symptoms even if mild, tylenol  helps with motrin and that usually helps, she has maxalt  and that helps, she has to take maxalt  8 days a month for migraines.    HPI:  Mary Spencer is a 58 y.o. female here as requested by Dr. Kristie for  chronic migraines.  Past medical history of chronic migraines presenting with dizzy spells and vertigo, chronic anxiety.  She has been managed by neurology in the past for her migraines.  She also has a history of thyroid  disease.She has been to ENT multiple times and several of them, she has had extensive cardiac workup. 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 She can have headaches. Maxalt  works well. Headaches are pulsating and pounding, light and sound sensitivity and nausea and vomiting.  She has daily vertigo and dizziness, it is just normal to her and she will set her hand on something and sits a lot, She has headaches which are minor 12 days a month and her main symptoms daily are vestibular. Nerve blocks helped in the past every 3 months occipital nerve blocks. She ses a land. She had a neurologist in [place] . She sees Dr. Louretta chiropractor. Migraines can last 24-72 hours. She is on Topiramate  and Alavil. No new symptoms or focal deficits, no hearing changes, no vision changes. She had had extended eeg, multiple MRIs. No other focal neurologic deficits, associated symptoms, inciting events or modifiable factors.   Meds tried: Elavil , Topiramate    Reviewed notes, labs and imaging from outside physicians, which showed:   Reviewed emergency room notes patient was seen in September 2019.  Patient moved to the area several months prior and had not been able to establish with a PCP.  She is followed with a neurologist for chronic migraine in the past which presented as dizziness and vertigo type symptoms.  Also stated she gets injections to the base of her head which she has not been getting.  No changes in her headaches currently.  She is on amitriptyline , topiramate  and  BuSpar .  Vitals, labs and exam were normal in the emergency room.   Patient was seen at Dr. Lebron office to establish care in October 2019.  She recently moved to this area from [place] .  Has a history of chronic migraines presenting with dizzy spells and vertigo.  Also has a history of chronic anxiety.  Has seen neurology in the past.  Stated she had dizziness and headaches infrequently.  Physical exam and neurologic exam was normal.  No changes were made to her medications.   Will request notes from prior neurologist's office   Observations/Objective:  Generalized: Well developed, in no acute distress  Mentation: Alert oriented to time, place, history taking. Follows all commands speech and language fluent   Assessment and Plan:  59 y.o. year old female  has a past medical history of Ankle fracture (06/2019), Diverticulitis, Finger fracture (06/2019), Migraines, Pneumonia, and Thyroid  disease. here with    ICD-10-CM   1. Chronic migraine without aura without status migrainosus, not intractable  G43.709 topiramate  (TOPAMAX ) 100 MG tablet    amitriptyline  (ELAVIL ) 75 MG tablet     Mrs Beaupre is doing well. We will continue topiramate  100mg  and amitriptyline  75mg  at bedtime and rizatriptan  as needed. Healthy lifestyle habits encouraged.  She will follow up in 1 year, sooner if needed.   No orders of the defined types were placed in this encounter.   Meds ordered this encounter  Medications   topiramate  (TOPAMAX ) 100 MG tablet    Sig: Take 1 tablet (100 mg total) by mouth at bedtime.    Dispense:  90 tablet    Refill:  3    Supervising Provider:   AHERN, ANTONIA B [[id number]]   rizatriptan  (MAXALT -MLT) 10 MG disintegrating tablet    Sig: Take 1 tablet (10 mg total) by mouth as needed for migraine. May repeat in 2 hours if needed    Dispense:  27 tablet    Refill:  3    This is a 90-day supply do not fill early    Supervising Provider:   AHERN, ANTONIA B [[id number]]    amitriptyline  (ELAVIL ) 75 MG tablet    Sig: Take 1 tablet (75 mg total) by mouth at bedtime.    Dispense:  90 tablet    Refill:  3    Supervising Provider:   INES ONETHA NOVAK [[id number]]     Follow Up Instructions:  I discussed the assessment and treatment plan with the patient. The patient was provided an opportunity to ask questions and all were answered. The patient agreed with the plan and demonstrated an understanding of the instructions.   The patient was advised to call back or seek an in-person evaluation if the symptoms worsen or if the condition fails to improve as anticipated.  I provided 15 minutes of non-face-to-face time during this encounter. Patient located at their place of residence during Mychart visit. Provider is in the office.    Vedha Tercero, NP

## 2023-09-16 NOTE — Patient Instructions (Signed)
 Below is our plan:  We will continue topiramate  100mg  and amitriptyline  75mg  at bedtime and rizatriptan  as needed.   Please make sure you are staying well hydrated. I recommend 50-60 ounces daily. Well balanced diet and regular exercise encouraged. Consistent sleep schedule with 6-8 hours recommended.   Please continue follow up with care team as directed.   Follow up with me in 1 year   You may receive a survey regarding today's visit. I encourage you to leave honest feed back as I do use this information to improve patient care. Thank you for seeing me today!   GENERAL HEADACHE INFORMATION:   Natural supplements: Magnesium Oxide or Magnesium Glycinate 500 mg at bed (up to 800 mg daily) Coenzyme Q10 300 mg in AM Vitamin B2- 200 mg twice a day   Add 1 supplement at a time since even natural supplements can have undesirable side effects. You can sometimes buy supplements cheaper (especially Coenzyme Q10) at www.webmailguide.co.za or at [facility].  Migraine with aura: There is increased risk for stroke in women with migraine with aura and a contraindication for the combined contraceptive pill for use by women who have migraine with aura. The risk for women with migraine without aura is lower. However other risk factors like smoking are far more likely to increase stroke risk than migraine. There is a recommendation for no smoking and for the use of OCPs without estrogen such as progestogen only pills particularly for women with migraine with aura.[name] People who have migraine headaches with auras may be 3 times more likely to have a stroke caused by a blood clot, compared to migraine patients who don't see auras. Women who take hormone-replacement therapy may be 30 percent more likely to suffer a clot-based stroke than women not taking medication containing estrogen. Other risk factors like smoking and high blood pressure may be  much more important.    Vitamins and herbs that show potential:   Magnesium:  Magnesium (250 mg twice a day or 500 mg at bed) has a relaxant effect on smooth muscles such as blood vessels. Individuals suffering from frequent or daily headache usually have low magnesium levels which can be increase with daily supplementation of 400-750 mg. Three trials found 40-90% average headache reduction  when used as a preventative. Magnesium may help with headaches are aura, the best evidence for magnesium is for migraine with aura is its thought to stop the cortical spreading depression we believe is the pathophysiology of migraine aura.Magnesium also demonstrated the benefit in menstrually related migraine.  Magnesium is part of the messenger system in the serotonin cascade and it is a good muscle relaxant.  It is also useful for constipation which can be a side effect of other medications used to treat migraine. Good sources include nuts, whole grains, and tomatoes. Side Effects: loose stool/diarrhea  Riboflavin (vitamin B 2) 200 mg twice a day. This vitamin assists nerve cells in the production of ATP a principal energy storing molecule.  It is necessary for many chemical reactions in the body.  There have been at least 3 clinical trials of riboflavin using 400 mg per day all of which suggested that migraine frequency can be decreased.  All 3 trials showed significant improvement in over half of migraine sufferers.  The supplement is found in bread, cereal, milk, meat, and poultry.  Most Americans get more riboflavin than the recommended daily allowance, however riboflavin deficiency is not necessary for the supplements to help prevent headache. Side effects: energizing,  green urine   Coenzyme Q10: This is present in almost all cells in the body and is critical component for the conversion of energy.  Recent studies have shown that a nutritional supplement of CoQ10 can reduce the frequency of migraine attacks by improving the energy production of cells as with riboflavin.  Doses of 150 mg twice a  day have been shown to be effective.   Melatonin: Increasing evidence shows correlation between melatonin secretion and headache conditions.  Melatonin supplementation has decreased headache intensity and duration.  It is widely used as a sleep aid.  Sleep is natures way of dealing with migraine.  A dose of 3 mg is recommended to start for headaches including cluster headache. Higher doses up to 15 mg has been reviewed for use in Cluster headache and have been used. The rationale behind using melatonin for cluster is that many theories regarding the cause of Cluster headache center around the disruption of the normal circadian rhythm in the brain.  This helps restore the normal circadian rhythm.   HEADACHE DIET: Foods and beverages which may trigger migraine Note that only 20% of headache patients are food sensitive. You will know if you are food sensitive if you get a headache consistently 20 minutes to 2 hours after eating a certain food. Only cut out a food if it causes headaches, otherwise you might remove foods you enjoy! What matters most for diet is to eat a well balanced healthy diet full of vegetables and low fat protein, and to not miss meals.   Chocolate, other sweets ALL cheeses except cottage and cream cheese Dairy products, yogurt, sour cream, ice cream Liver Meat extracts (Bovril, Marmite, meat tenderizers) Meats or fish which have undergone aging, fermenting, pickling or smoking. These include: Hotdogs,salami,Lox,sausage, mortadellas,smoked salmon, pepperoni, Pickled herring Pods of broad bean (English beans, Chinese pea pods, Italian (fava) beans, lima and navy beans Ripe avocado, ripe banana Yeast extracts or active yeast preparations such as Brewer's or Fleishman's (commercial bakes goods are permitted) Tomato based foods, pizza (lasagna, etc.)   MSG (monosodium glutamate) is disguised as many things; look for these common aliases: Monopotassium glutamate Autolysed  yeast Hydrolysed protein Sodium caseinate "flavorings" "all natural preservatives Nutrasweet   Avoid all other foods that convincingly provoke headaches.   Resources: The Dizzy Bluford Aid Your Headache Diet, migrainestrong.com  https://zamora-andrews.com/   Caffeine and Migraine For patients that have migraine, caffeine intake more than 3 days per week can lead to dependency and increased migraine frequency. I would recommend cutting back on your caffeine intake as best you can. The recommended amount of caffeine is 200-300 mg daily, although migraine patients may experience dependency at even lower doses. While you may notice an increase in headache temporarily, cutting back will be helpful for headaches in the long run. For more information on caffeine and migraine, visit: https://americanmigrainefoundation.org/resource-library/caffeine-and-migraine/   Headache Prevention Strategies:   1. Maintain a headache diary; learn to identify and avoid triggers.  - This can be a simple note where you log when you had a headache, associated symptoms, and medications used - There are several smartphone apps developed to help track migraines: Migraine Buddy, Migraine Monitor, Curelator N1-Headache App   Common triggers include: Emotional triggers: Emotional/Upset family or friends Emotional/Upset occupation Business reversal/success Anticipation anxiety Crisis-serious Post-crisis periodNew job/position   Physical triggers: Vacation Day Weekend Strenuous Exercise High Altitude Location New Move Menstrual Day Physical Illness Oversleep/Not enough sleep Weather changes Light: Photophobia or light sesnitivity treatment involves a balance  between desensitization and reduction in overly strong input. Use dark polarized glasses outside, but not inside. Avoid bright or fluorescent light, but do not dim environment to the point that going into a  normally lit room hurts. Consider FL-41 tint lenses, which reduce the most irritating wavelengths without blocking too much light.  These can be obtained at axonoptics.com or theraspecs.com Foods: see list above.   2. Limit use of acute treatments (over-the-counter medications, triptans, etc.) to no more than 2 days per week or 10 days per month to prevent medication overuse headache (rebound headache).     3. Follow a regular schedule (including weekends and holidays): Don't skip meals. Eat a balanced diet. 8 hours of sleep nightly. Minimize stress. Exercise 30 minutes per day. Being overweight is associated with a 5 times increased risk of chronic migraine. Keep well hydrated and drink 6-8 glasses of water per day.   4. Initiate non-pharmacologic measures at the earliest onset of your headache. Rest and quiet environment. Relax and reduce stress. Breathe2Relax is a free app that can instruct you on    some simple relaxtion and breathing techniques. Http://Dawnbuse.com is a    free website that provides teaching videos on relaxation.  Also, there are  many apps that   can be downloaded for "mindful" relaxation.  An app called YOGA NIDRA will help walk you through mindfulness. Another app called Calm can be downloaded to give you a structured mindfulness guide with daily reminders and skill development. Headspace for guided meditation Mindfulness Based Stress Reduction Online Course: www.palousemindfulness.com Cold compresses.   5. Don't wait!! Take the maximum allowable dosage of prescribed medication at the first sign of migraine.   6. Compliance:  Take prescribed medication regularly as directed and at the first sign of a migraine.   7. Communicate:  Call your physician when problems arise, especially if your headaches change, increase in frequency/severity, or become associated with neurological symptoms (weakness, numbness, slurred speech, etc.). Proceed to emergency room if you experience  new or worsening symptoms or symptoms do not resolve, if you have new neurologic symptoms or if headache is severe, or for any concerning symptom.   8. Headache/pain management therapies: Consider various complementary methods, including medication, behavioral therapy, psychological counselling, biofeedback, massage therapy, acupuncture, dry needling, and other modalities.  Such measures may reduce the need for medications. Counseling for pain management, where patients learn to function and ignore/minimize their pain, seems to work very well.   9. Recommend changing family's attention and focus away from patient's headaches. Instead, emphasize daily activities. If first question of day is 'How are your headaches/Do you have a headache today?', then patient will constantly think about headaches, thus making them worse. Goal is to re-direct attention away from headaches, toward daily activities and other distractions.   10. Helpful Websites: www.AmericanHeadacheSociety.org patenthood.ch www.headaches.org tightmarket.nl www.achenet.org

## 2023-09-17 ENCOUNTER — Encounter: Payer: Self-pay | Admitting: Family Medicine

## 2023-09-17 ENCOUNTER — Telehealth (INDEPENDENT_AMBULATORY_CARE_PROVIDER_SITE_OTHER): Payer: BC Managed Care – PPO | Admitting: Family Medicine

## 2023-09-17 DIAGNOSIS — G43709 Chronic migraine without aura, not intractable, without status migrainosus: Secondary | ICD-10-CM

## 2023-09-17 MED ORDER — TOPIRAMATE 100 MG PO TABS: 100.0000 mg | ORAL_TABLET | Freq: Every day | ORAL | 3 refills | Status: DC

## 2023-09-17 MED ORDER — AMITRIPTYLINE HCL 75 MG PO TABS: 75.0000 mg | ORAL_TABLET | Freq: Every day | ORAL | 3 refills | Status: DC

## 2023-09-17 MED ORDER — RIZATRIPTAN BENZOATE 10 MG PO TBDP: 10.0000 mg | ORAL_TABLET | ORAL | 3 refills | Status: DC | PRN

## 2023-11-02 IMAGING — CT CT ABD-PELV W/ CM
2 of 5 series · 15 of 46 positions shown, 17 images · IV contrast (APPLIED)
Comparison: None.

CLINICAL DATA: Pain left lower quadrant

EXAM:
CT ABDOMEN AND PELVIS WITH CONTRAST
TECHNIQUE: Multidetector CT imaging of the abdomen and pelvis was performed
using the standard protocol following bolus administration of
intravenous contrast.
CONTRAST:  100mL OMNIPAQUE IOHEXOL 300 MG/ML  SOLN

[Series 2: abd pel w · axial · 0.77mm/px · z∈[-992,-537]mm · 12 of 103 slices shown, 14 images]
[im 6/103  soft-tissue]
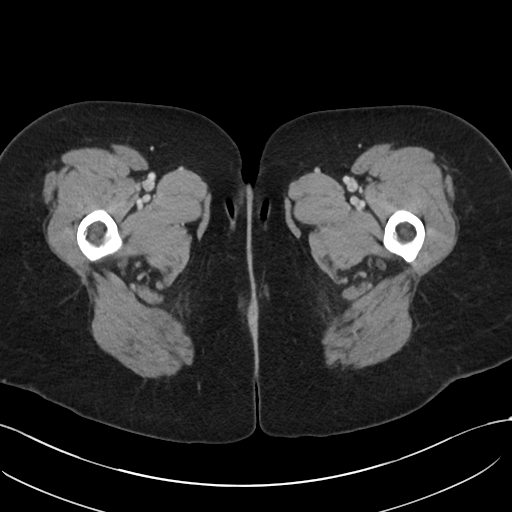
[im 6/103  bone]
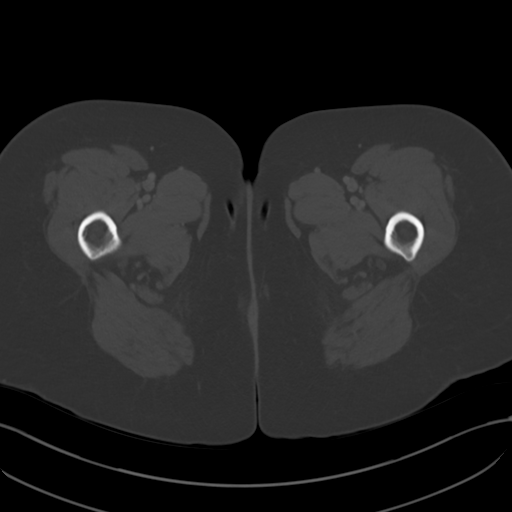
[im 17/103  soft-tissue]
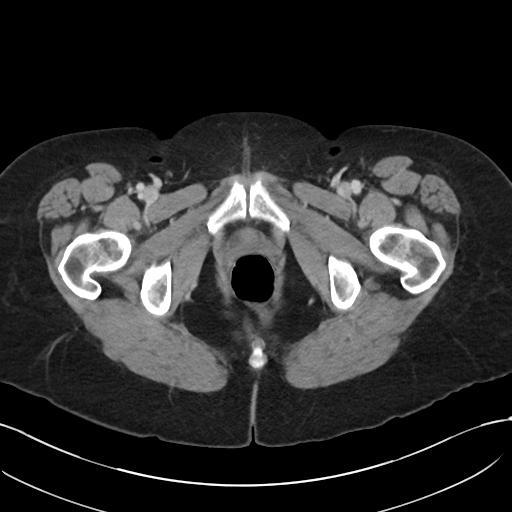
[im 22/103  soft-tissue]
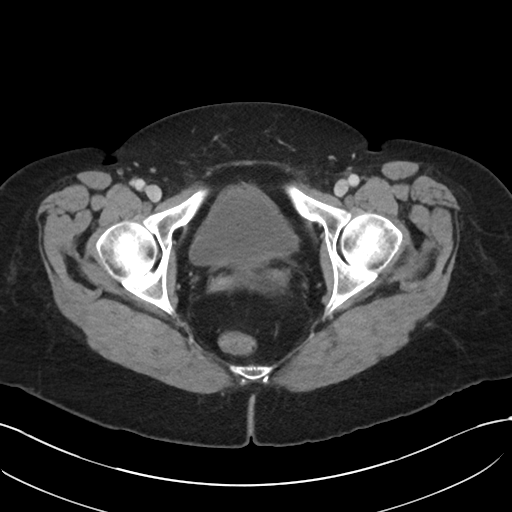
[im 33/103  soft-tissue]
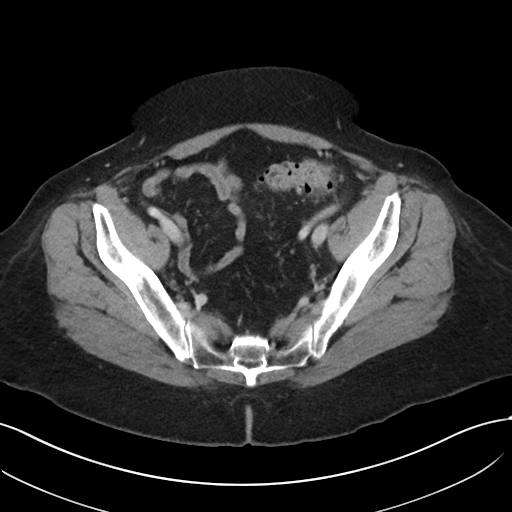
[im 38/103  soft-tissue]
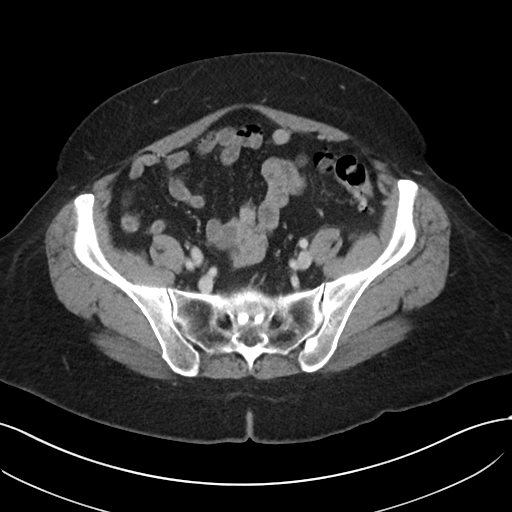
[im 49/103  soft-tissue]
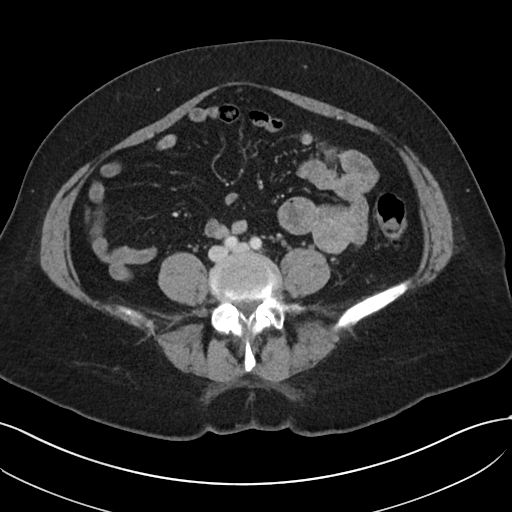
[im 54/103  soft-tissue]
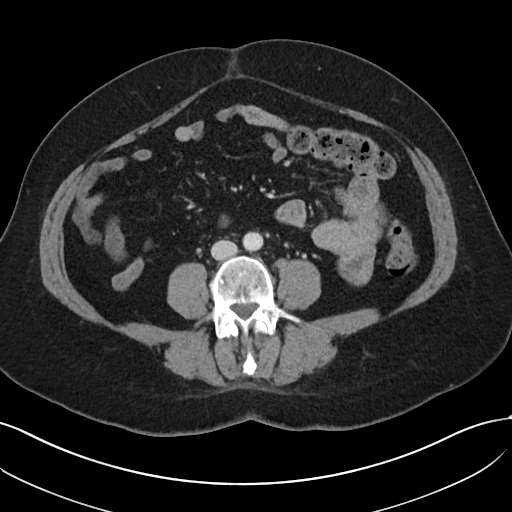
[im 65/103  soft-tissue]
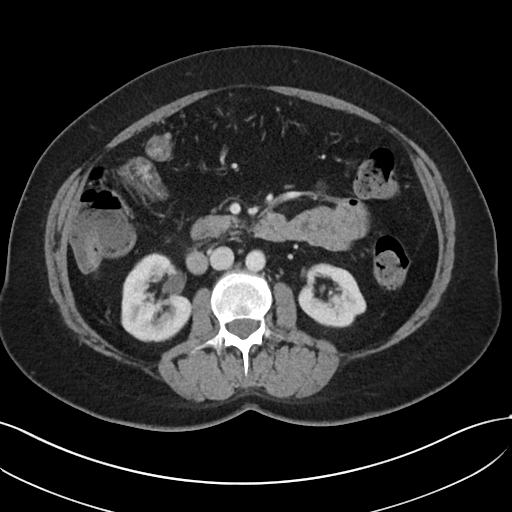
[im 70/103  soft-tissue]
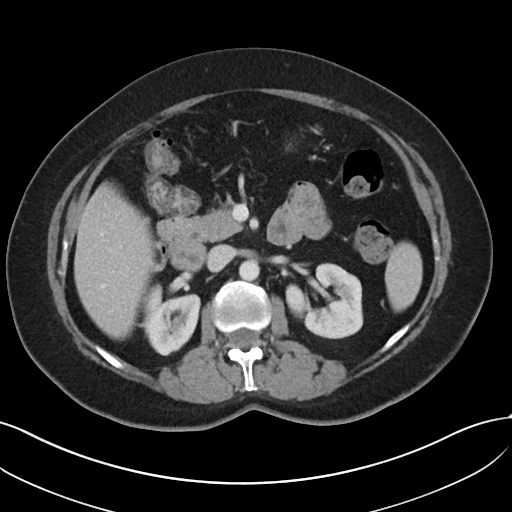
[im 70/103  bone]
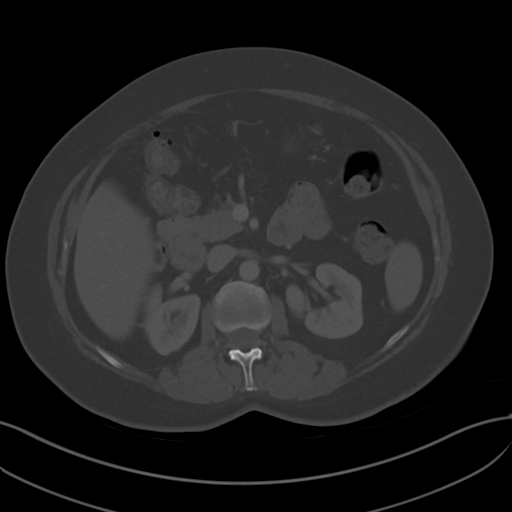
[im 81/103  soft-tissue]
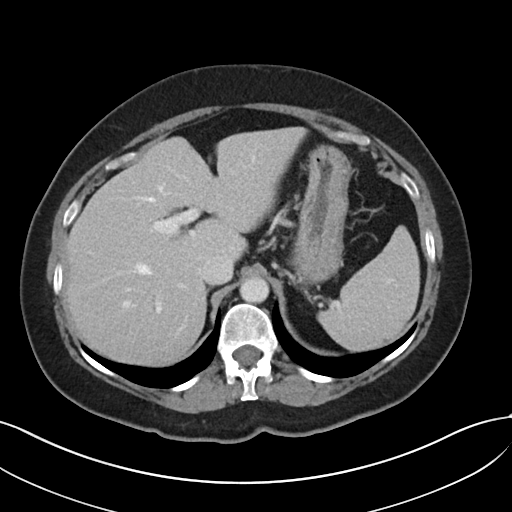
[im 86/103  soft-tissue]
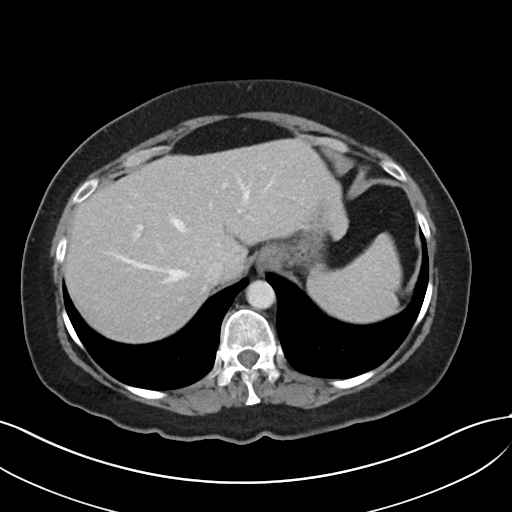
[im 97/103  soft-tissue]
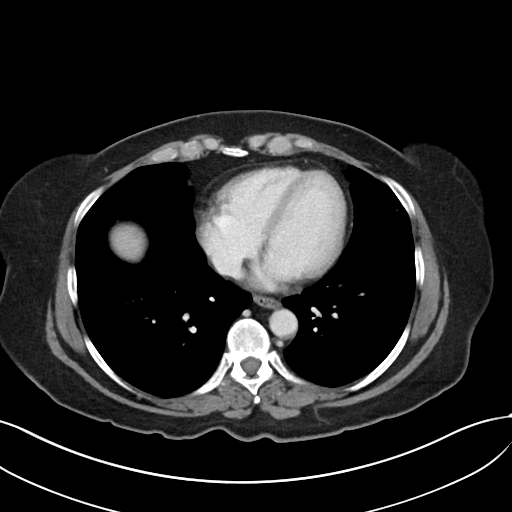

[Series 5: coronal · coronal · 0.72mm/px · 3 of 106 slices shown]
[im 36/106  soft-tissue]
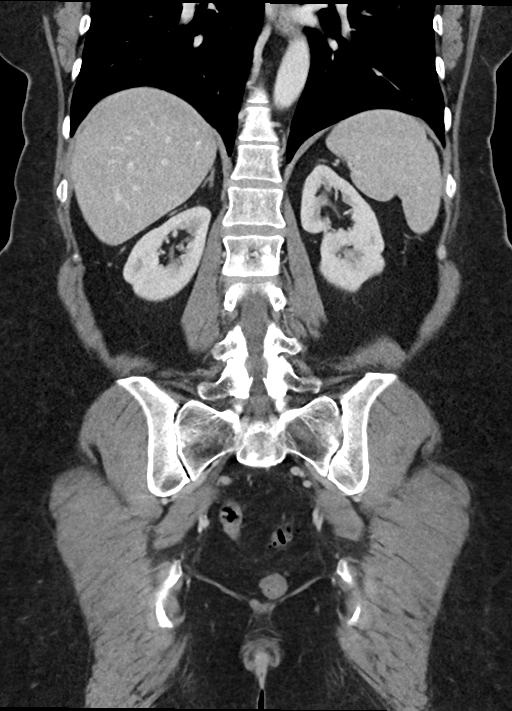
[im 47/106  soft-tissue]
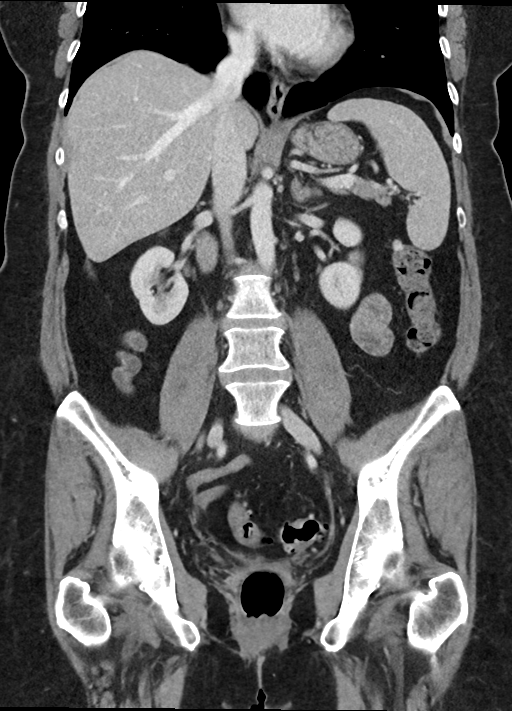
[im 59/106  soft-tissue]
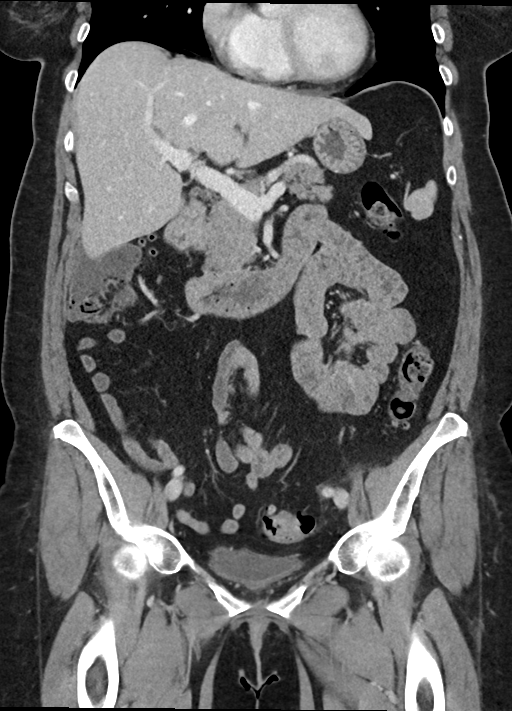

[15 of 46 positions shown; findings below may reference images not displayed]

FINDINGS: Lower chest: Unremarkable.

Hepatobiliary: Liver measures 16.4 cm in length. There is no
dilation of bile ducts. Surgical clips are seen in gallbladder
fossa.

Pancreas: No focal abnormality is seen.

Spleen: Spleen measures 12.6 cm in maximum diameter.

Adrenals/Urinary Tract: Adrenals are unremarkable. There is no
hydronephrosis. There are no renal or ureteral stones. Urinary
bladder is not distended.

Stomach/Bowel: Stomach is unremarkable. Small bowel loops are not
dilated. Cecum is higher than usual in position. Normal-appearing
appendix is noted in the subhepatic location. There is mild wall
thickening in the hepatic flexure. Scattered diverticula are seen in
the colon. There is mild stranding in the fat planes adjacent to the
junction of descending and sigmoid colon in the left lower quadrant.
There is no loculated pericolic fluid collection.

Vascular/Lymphatic: Vascular structures are unremarkable. No
significant lymphadenopathy seen.

Reproductive: Uterus is not seen.

Other: There is no ascites or pneumoperitoneum. Small umbilical
hernia containing fat is seen. There is possible small left inguinal
hernia containing fat.

Musculoskeletal: Unremarkable.
IMPRESSION: There is no evidence of intestinal obstruction or pneumoperitoneum.
There is no hydronephrosis. Appendix is not dilated.

Diverticulosis of colon. There is mild pericolic stranding at the
junction of descending and sigmoid colon suggesting mild acute or
chronic diverticulitis. There is no loculated pericolic abscess.

There is mild diffuse wall thickening in the hepatic flexure,
possibly due to incomplete distention. Less likely possibility would
be nonspecific colitis.

Other findings as described in the body of the report.

## 2024-01-30 ENCOUNTER — Other Ambulatory Visit (HOSPITAL_BASED_OUTPATIENT_CLINIC_OR_DEPARTMENT_OTHER): Payer: Self-pay

## 2024-01-30 ENCOUNTER — Emergency Department (HOSPITAL_BASED_OUTPATIENT_CLINIC_OR_DEPARTMENT_OTHER): Admitting: Radiology

## 2024-01-30 ENCOUNTER — Emergency Department (HOSPITAL_BASED_OUTPATIENT_CLINIC_OR_DEPARTMENT_OTHER)

## 2024-01-30 ENCOUNTER — Encounter (HOSPITAL_BASED_OUTPATIENT_CLINIC_OR_DEPARTMENT_OTHER): Payer: Self-pay | Admitting: Emergency Medicine

## 2024-01-30 ENCOUNTER — Other Ambulatory Visit: Payer: Self-pay

## 2024-01-30 ENCOUNTER — Emergency Department (HOSPITAL_BASED_OUTPATIENT_CLINIC_OR_DEPARTMENT_OTHER)
Admission: EM | Admit: 2024-01-30 | Discharge: 2024-01-30 | Disposition: A | Discharge status: Home / Self Care | Attending: Emergency Medicine | Admitting: Emergency Medicine

## 2024-01-30 DIAGNOSIS — K529 Noninfective gastroenteritis and colitis, unspecified: Secondary | ICD-10-CM | POA: Diagnosis not present

## 2024-01-30 DIAGNOSIS — R1012 Left upper quadrant pain: Secondary | ICD-10-CM | POA: Diagnosis present

## 2024-01-30 LAB — CBC WITH DIFFERENTIAL/PLATELET
Abs Immature Granulocytes: 0.06 10*3/uL (ref 0.00–0.07)
Basophils Absolute: 0.1 10*3/uL (ref 0.0–0.1)
Basophils Relative: 1 %
Eosinophils Absolute: 0 10*3/uL (ref 0.0–0.5)
Eosinophils Relative: 0 %
HCT: 39.1 % (ref 36.0–46.0)
Hemoglobin: 12.9 g/dL (ref 12.0–15.0)
Immature Granulocytes: 1 %
Lymphocytes Relative: 39 %
Lymphs Abs: 3.3 10*3/uL (ref 0.7–4.0)
MCH: 28.5 pg (ref 26.0–34.0)
MCHC: 33 g/dL (ref 30.0–36.0)
MCV: 86.5 fL (ref 80.0–100.0)
Monocytes Absolute: 0.5 10*3/uL (ref 0.1–1.0)
Monocytes Relative: 6 %
Neutro Abs: 4.5 10*3/uL (ref 1.7–7.7)
Neutrophils Relative %: 53 %
Platelets: 315 10*3/uL (ref 150–400)
RBC: 4.52 MIL/uL (ref 3.87–5.11)
RDW: 13.5 % (ref 11.5–15.5)
WBC: 8.4 10*3/uL (ref 4.0–10.5)
nRBC: 0 % (ref 0.0–0.2)

## 2024-01-30 LAB — COMPREHENSIVE METABOLIC PANEL WITH GFR
ALT: 14 U/L (ref 0–44)
AST: 13 U/L — ABNORMAL LOW (ref 15–41)
Albumin: 3.7 g/dL (ref 3.5–5.0)
Alkaline Phosphatase: 70 U/L (ref 38–126)
Anion gap: 12 (ref 5–15)
BUN: 13 mg/dL (ref 6–20)
CO2: 24 mmol/L (ref 22–32)
Calcium: 9.1 mg/dL (ref 8.9–10.3)
Chloride: 104 mmol/L (ref 98–111)
Creatinine, Ser: 0.99 mg/dL (ref 0.44–1.00)
GFR, Estimated: >60 mL/min (ref >60)
Glucose, Bld: 85 mg/dL (ref 70–99)
Potassium: 3.8 mmol/L (ref 3.5–5.1)
Sodium: 139 mmol/L (ref 135–145)
Total Bilirubin: 0.5 mg/dL (ref 0.0–1.2)
Total Protein: 6.6 g/dL (ref 6.5–8.1)

## 2024-01-30 LAB — LIPASE, BLOOD: Lipase: 35 U/L (ref 11–51)

## 2024-01-30 MED ORDER — KETOROLAC TROMETHAMINE 10 MG PO TABS
10.0000 mg | ORAL_TABLET | Freq: Four times a day (QID) | ORAL | 0 refills | Status: AC | PRN
  Filled 2024-01-30: qty 20, 5d supply, fill #0

## 2024-01-30 MED ORDER — KETOROLAC TROMETHAMINE 30 MG/ML IJ SOLN
15.0000 mg | Freq: Once | INTRAMUSCULAR | Status: AC
  Administered 2024-01-30: 15 mg via INTRAVENOUS
  Filled 2024-01-30: qty 1

## 2024-01-30 MED ORDER — IOHEXOL 300 MG/ML  SOLN
100.0000 mL | Freq: Once | INTRAMUSCULAR | Status: AC | PRN
  Administered 2024-01-30: 100 mL via INTRAVENOUS

## 2024-01-30 NOTE — ED Provider Notes (Signed)
 [place] EMERGENCY DEPARTMENT AT [facility] Provider Note   CSN: [id number] Arrival date & time: 01/30/24  0930     History  Chief Complaint  Patient presents with   Abdominal Pain    Mary Spencer is a 58 y.o. female.  Patient to ED with pain in the upper left abdomen for the past 3 days. She reports nausea without vomiting only when the pain is heightened. No change in bowel movements. No fever. She has had bronchitis that started 3 weeks ago and is improving after steroids and antibiotics provided by her primary care doctor.   The history is provided by the patient. No language interpreter was used.  Abdominal Pain      Home Medications Prior to Admission medications   Medication Sig Start Date End Date Taking? Authorizing Provider  albuterol (VENTOLIN HFA) 108 (90 Base) MCG/ACT inhaler Inhale 2 puffs into the lungs every 6 (six) hours as needed for wheezing. 01/21/24 01/20/25 Yes [provider]  benzonatate (TESSALON) 200 MG capsule Take by mouth. 01/21/24  Yes [provider]  doxycycline (MONODOX) 100 MG capsule Take 100 mg by mouth 2 (two) times daily. 01/21/24  Yes [provider]  ketorolac  (TORADOL ) 10 MG tablet Take 1 tablet (10 mg total) by mouth every 6 (six) hours as needed. 01/30/24  Yes Shemeca Lukasik, Clovis Dar, PA-C  predniSONE (DELTASONE) 20 MG tablet Take 20 mg by mouth 2 (two) times daily. 01/21/24  Yes [provider]  acetaminophen  (TYLENOL ) 500 MG tablet Take 1,000 mg by mouth every 8 (eight) hours as needed for moderate pain or headache.    [provider]  Alum Hydroxide-Mag Trisilicate (GAVISCON) 80-14.2 MG CHEW Chew 2 tablets by mouth 2 (two) times daily as needed (heartburn).    [provider]  amitriptyline  (ELAVIL ) 75 MG tablet Take 1 tablet (75 mg total) by mouth at bedtime. 09/17/23   Lomax, Amy, NP  busPIRone  (BUSPAR ) 10 MG tablet Take 10 mg by mouth See admin instructions. Take 10 mg daily,  may take a second 10 mg dose as needed for anxiety    [provider]  calcium carbonate (TUMS - DOSED IN MG ELEMENTAL CALCIUM) 500 MG chewable tablet Chew 2-3 tablets by mouth 5 (five) times daily as needed for indigestion or heartburn.    [provider]  cholestyramine light (PREVALITE) 4 GM/DOSE powder Take 4 g by mouth 3 (three) times daily with meals.    [provider]  rizatriptan  (MAXALT -MLT) 10 MG disintegrating tablet Take 1 tablet (10 mg total) by mouth as needed for migraine. May repeat in 2 hours if needed 09/17/23   Lomax, Amy, NP  topiramate  (TOPAMAX ) 100 MG tablet Take 1 tablet (100 mg total) by mouth at bedtime. 09/17/23   Lomax, Amy, NP      Allergies    Penicillins, Erythromycin, Keflex [cephalexin], and Sulfa antibiotics    Review of Systems   Review of Systems  Gastrointestinal:  Positive for abdominal pain.    Physical Exam Updated Vital Signs BP 105/61 (BP Location: Left Arm)   Pulse 78   Temp (!) 97.4 F (36.3 C) (Oral)   Resp 17   Ht 5\' 6"  (1.676 m)   Wt 97.5 kg   SpO2 100%   BMI 34.70 kg/m  Physical Exam Vitals and nursing note reviewed.  Constitutional:      General: She is not in acute distress.    Appearance: She is well-developed.  HENT:  Head: Normocephalic.  Cardiovascular:     Rate and Rhythm: Normal rate and regular rhythm.     Heart sounds: No murmur heard. Pulmonary:     Effort: Pulmonary effort is normal.     Breath sounds: No wheezing, rhonchi or rales.  Abdominal:     General: There is no distension.     Palpations: Abdomen is soft.     Tenderness: There is abdominal tenderness in the right upper quadrant and left upper quadrant. There is no left CVA tenderness.  Neurological:     Mental Status: She is alert.     ED Results / Procedures / Treatments   Labs (all labs ordered are listed, but only abnormal results are displayed) Labs Reviewed  COMPREHENSIVE METABOLIC PANEL WITH GFR - Abnormal;  Notable for the following components:      Result Value   AST 13 (*)    All other components within normal limits  CBC WITH DIFFERENTIAL/PLATELET  LIPASE, BLOOD    EKG None  Radiology No results found.  Procedures Procedures    Medications Ordered in ED Medications  ketorolac  (TORADOL ) 30 MG/ML injection 15 mg (15 mg Intravenous Given 01/30/24 1223)  iohexol  (OMNIPAQUE ) 300 MG/ML solution 100 mL (100 mLs Intravenous Contrast Given 01/30/24 1307)    ED Course/ Medical Decision Making/ A&P                                 Medical Decision Making This patient presents to the ED for concern of LUQ abdominal pain, this involves an extensive number of treatment options, and is a complaint that carries with it a high risk of complications and morbidity.  The differential diagnosis includes pancreatitis, PUD, perforation, PNA, rib fracture, colitis   Co morbidities that complicate the patient evaluation  Bronchitis, smoker (recently quit)   Additional history obtained:  Additional history and/or information obtained from chart review, notable for No recent emergency dept visit.    Lab Tests:  I Ordered, and personally interpreted labs.  The pertinent results include:   No abnormalities on CBC, CMET    Imaging Studies ordered:  I ordered imaging studies including CXR with left ribs I independently visualized and interpreted imaging which showed NAD I agree with the radiologist interpretation CT abd/pel -   IMPRESSION: 1. The colon is decompressed and demonstrates mild diffuse wall thickening. Differential considerations include colitis (infectious and inflammatory). 2. Colonic diverticulosis. 3. Stable, small splenic artery aneurysm measuring 1.2 cm.    Cardiac Monitoring:  The patient was maintained on a cardiac monitor.  I personally viewed and interpreted the cardiac monitored which showed an underlying rhythm of: n/a   Medicines ordered and prescription  drug management:  I ordered medication including Toradol   for pain Reevaluation of the patient after these medicines showed that the patient worsened I have reviewed the patients home medicines and have made adjustments as needed   Test Considered:  N/a   Critical Interventions:  N/a   Consultations Obtained:  I requested consultation with the n/a,  and discussed lab and imaging findings as well as pertinent plan - they recommend: n/a   Problem List / ED Course:  Patient with LUQ abdominal pain  History of bronchitis x 3 weeks, improving.  Declines anything for pain on initial evaluation Labs unremarkable, CXR negative, however, patient is still experiencing a lot of pain Will obtain CT abd/pel to complete evaluation. She is now requesting  Toradol  for pain (does not want a narcotic)   Reevaluation:  After the interventions noted above, I reevaluated the patient and found that they have :improved   Social Determinants of Health:  Former smoker   Disposition:  After consideration of the diagnostic results and the patients response to treatment, I feel that the patient would benefit from discharge home.   Amount and/or Complexity of Data Reviewed Labs: ordered. Radiology: ordered.  Risk Prescription drug management.           Final Clinical Impression(s) / ED Diagnoses Final diagnoses:  Colitis    Rx / DC Orders ED Discharge Orders          Ordered    ketorolac  (TORADOL ) 10 MG tablet  Every 6 hours PRN        01/30/24 1526              Mandy Second, PA-C 02/02/24 1719    Scarlette Currier, MD 02/02/24 2216

## 2024-01-30 NOTE — Discharge Instructions (Signed)
 Follow up with your doctor if symptoms persist. Toradol as prescribed for comfort. Follow up with your doctor if symptoms persist.   If symptoms worsen - if you run a high fever, have more severe pain, have uncontrolled vomiting - please return to the ED for further evaluation.

## 2024-01-30 NOTE — ED Triage Notes (Signed)
 C/o left sided rib/abd pain. Worse with movement. States has had bronchitis for 3 weeks.

## 2024-09-09 ENCOUNTER — Other Ambulatory Visit: Payer: Self-pay | Admitting: *Deleted

## 2024-09-09 DIAGNOSIS — G43709 Chronic migraine without aura, not intractable, without status migrainosus: Secondary | ICD-10-CM

## 2024-09-09 MED ORDER — AMITRIPTYLINE HCL 75 MG PO TABS: 75.0000 mg | ORAL_TABLET | Freq: Every day | ORAL | 0 refills | Status: DC

## 2024-09-09 NOTE — Telephone Encounter (Signed)
 Last seen on 09/17/23 Follow up scheduled on 09/22/24

## 2024-09-15 ENCOUNTER — Other Ambulatory Visit: Payer: Self-pay

## 2024-09-15 DIAGNOSIS — G43709 Chronic migraine without aura, not intractable, without status migrainosus: Secondary | ICD-10-CM

## 2024-09-15 MED ORDER — TOPIRAMATE 100 MG PO TABS: 100.0000 mg | ORAL_TABLET | Freq: Every day | ORAL | 0 refills | Status: DC

## 2024-09-22 ENCOUNTER — Telehealth: Payer: BC Managed Care – PPO | Admitting: Family Medicine

## 2024-09-22 ENCOUNTER — Encounter: Payer: Self-pay | Admitting: Family Medicine

## 2024-09-22 DIAGNOSIS — G43709 Chronic migraine without aura, not intractable, without status migrainosus: Secondary | ICD-10-CM | POA: Diagnosis not present

## 2024-09-22 DIAGNOSIS — R682 Dry mouth, unspecified: Secondary | ICD-10-CM

## 2024-09-22 DIAGNOSIS — R0683 Snoring: Secondary | ICD-10-CM | POA: Diagnosis not present

## 2024-09-22 DIAGNOSIS — R519 Headache, unspecified: Secondary | ICD-10-CM

## 2024-09-22 DIAGNOSIS — Z9189 Other specified personal risk factors, not elsewhere classified: Secondary | ICD-10-CM | POA: Diagnosis not present

## 2024-09-22 MED ORDER — AMITRIPTYLINE HCL 75 MG PO TABS: 75.0000 mg | ORAL_TABLET | Freq: Every day | ORAL | 3 refills | Status: AC

## 2024-09-22 MED ORDER — RIZATRIPTAN BENZOATE 10 MG PO TBDP: 10.0000 mg | ORAL_TABLET | ORAL | 3 refills | Status: AC | PRN

## 2024-09-22 MED ORDER — TOPIRAMATE 100 MG PO TABS: 150.0000 mg | ORAL_TABLET | Freq: Every day | ORAL | 3 refills | Status: AC

## 2024-09-22 NOTE — Progress Notes (Signed)
 "  PATIENT: Mary Spencer DOB: 28-Apr-1966  REASON FOR VISIT: follow up HISTORY FROM: patient  Virtual Visit via Mychart Video Note  I connected with Suzen Theodis Akamine on 09/22/24 at  8:30 AM EST by video and verified that I am speaking with the correct person using two identifiers.   I discussed the limitations, risks, security and privacy concerns of performing an evaluation and management service by video and the availability of in person appointments. I also discussed with the patient that there may be a patient responsible charge related to this service. The patient expressed understanding and agreed to proceed.   History of Present Illness:  09/22/2024 ALL (Mychart): Mary Spencer returns for follow up for migraines. She continues topiramate , amitriptyline  and rizatriptan . She reports having at least 20 headache days. Wakes most days with headache. She is waking up in the middle of the night with really bad headaches. She has 1-2 severe migraines per month. She feels they have worsened over the past 6-8 months.Rizatriptan  works well for really bad headaches. She usually takes 1-2 per month. She is taking Tylenol  most every night. She takes a dose during the day 2-3 times a week. She does snore. She wakes with dry mouth. No family history of sleep apnea.   09/17/23 ALL:  Mary Spencer returns for migraines. She was last seen 09/2022 and doing well on topiramate , amitriptyline  and rizatriptan . Since, she reports migraines remain well managed. She may have a handful of migraines per year. She has tension style headaches about 4-5 times a month. OTC analgesics usually abort tension headache. Rizatriptan  works well to abort migraines. She does have significant dizziness with migraines.   09/13/2022 ALL:  Mary Spencer returns for follow up for migraines. She continues amitriptyline  75mg  and topiramate  100mg  QHS. Rizatriptan  used for abortive therapy. She is doing very well. She may have 1-2 migrainous  headaches per month. Rizatriptan  works well to abort. She reports dizziness waxes and wanes. She is working on healthy lifestyle habits. She drinks multiple cups of coffee. She admits she could drink more water. She is exercising more. She is followed by PCP regularly.   09/13/2021 ALL: Mary Spencer is a 59 y.o. female here today for follow up for migraines. She discontinued Ajovy  at last visit with Dr Ines and continued amitriptyline  75mg  and topiramate  100mg  daily. MRI was normal. She was referred to vestibular therapy. She went for an evaluation and unable to return. She was given home exercises. She reports migraines have significantly improved. Stress levels are much better since switching employment. She has occasional bouts of vertigo when she turns or moves her head quickly. Her husband is recovering from sepsis from an obstructed kidney stone. She has had a few more headaches over the past 2 weeks but feels it is related to not sleeping and stress. Rizatriptan  usually works well for abortive therapy.   HISTORY (copied from Dr Sharion previous note)  11/16/2020: The Ajovy  made it worse 3 months, more frequent mild headaches. Aimovig  only did it once.  Stopping the ajovy  made it go away. She still has dizziness stable. She is having diplopia, side by side, occasionally randomly, she is going to talk to the optometrist, she is due for new glasses, started 3-4 months ago, no head trauma or new medications, she feels tired and stressed all the time and her job is extremely stressful. She started smoking again, she is trying to quit, it is getting more frequent, closing one eye helps. She still have the  vestibular migraines. No headaches, just daily vertigo/dizziness, room spinning also feeling like being on the ocean but can have frank room spinning. Chronic daily continuous. Diplopia is binocular.    Patient complains of symptoms per HPI as well as the following symptoms: vertigo, stress .  Pertinent negatives and positives per HPI. All others negative   Interval history 11/16/2019: Could not get the Aimovig . We will try Ajovy , gave her 3 months samples in the meantime we will try and get it approved. She is stable, she has a lot of dizziness and vestibular symptomsshe is having more headaches, more stress at work, daily vertigo and dizziness, daily headaches and symptoms even if mild, tylenol  helps with motrin and that usually helps, she has maxalt  and that helps, she has to take maxalt  8 days a month for migraines.    HPI:  Mary Spencer is a 59 y.o. female here as requested by Dr. Kristie for chronic migraines.  Past medical history of chronic migraines presenting with dizzy spells and vertigo, chronic anxiety.  She has been managed by neurology in the past for her migraines.  She also has a history of thyroid  disease.She has been to ENT multiple times and several of them, she has had extensive cardiac workup. 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 She can have headaches. Maxalt  works well. Headaches are pulsating and pounding, light and sound sensitivity and nausea and vomiting.  She has daily vertigo and dizziness, it is just normal to her and she will set her hand on something and sits a lot, She has headaches which are minor 12 days a month and her main symptoms daily are vestibular. Nerve blocks helped in the past every 3 months occipital nerve blocks. She ses a land. She had a neurologist in [place] . She sees Dr. Louretta chiropractor. Migraines can last 24-72 hours. She is on Topiramate  and Alavil. No new symptoms or focal deficits, no hearing changes, no vision changes. She had had extended eeg, multiple MRIs. No other focal neurologic deficits,  associated symptoms, inciting events or modifiable factors.   Meds tried: Elavil , Topiramate    Reviewed notes, labs and imaging from outside physicians, which showed:   Reviewed emergency room notes patient was seen in September 2019.  Patient moved to the area several months prior and had not been able to establish with a PCP.  She is followed with a neurologist for chronic migraine in the past which presented as dizziness and vertigo type symptoms.  Also stated she gets injections to the base of her head which she has not been getting.  No changes in her headaches currently.  She is on amitriptyline , topiramate  and BuSpar .  Vitals, labs and exam were normal in the emergency room.   Patient was seen at Dr. Lebron office to establish care in October 2019.  She recently moved to this area from [place] .  Has a history of chronic migraines presenting with dizzy spells and vertigo.  Also has a history of chronic anxiety.  Has seen neurology in the past.  Stated she had dizziness and headaches infrequently.  Physical exam and neurologic exam was normal.  No changes were made to her medications.   Will request notes from prior neurologist's office   Observations/Objective:  Generalized: Well developed, in no  acute distress  Mentation: Alert oriented to time, place, history taking. Follows all commands speech and language fluent   Assessment and Plan:  59 y.o. year old female  has a past medical history of Ankle fracture (06/2019), Diverticulitis, Finger fracture (06/2019), Migraines, Pneumonia, and Thyroid  disease. here with    ICD-10-CM   1. Chronic migraine without aura without status migrainosus, not intractable  G43.709 amitriptyline  (ELAVIL ) 75 MG tablet    topiramate  (TOPAMAX ) 100 MG tablet    Home sleep test    2. Morning headache  R51.9 Home sleep test    3. Snoring  R06.83 Home sleep test    4. Dry mouth  R68.2 Home sleep test    5. At risk for sleep apnea  Z91.89 Home sleep  test      Mrs Sharpe reports worsening headaches over the past 6-8 months. Now waking most mornings with headaches and having 1-2 severe headaches to wake her form sleep. We will continue topiramate  but increase dose to 150mg  daily and continue mitriptyline 75mg  at bedtime and rizatriptan  as needed. I will order a HST for eval. Consider CPAP if needed. If negative, will consider adding CGRP. Healthy lifestyle habits encouraged. She will follow up pending sleep study.    Orders Placed This Encounter  Procedures   Home sleep test    Standing Status:   Future    Expiration Date:   09/22/2025    Where should this test be performed::   [facility]    Meds ordered this encounter  Medications   amitriptyline  (ELAVIL ) 75 MG tablet    Sig: Take 1 tablet (75 mg total) by mouth at bedtime.    Dispense:  90 tablet    Refill:  3    Supervising Provider:   YAN, YIJUN [[id number]]   topiramate  (TOPAMAX ) 100 MG tablet    Sig: Take 1.5 tablets (150 mg total) by mouth at bedtime.    Dispense:  135 tablet    Refill:  3    Supervising Provider:   YAN, YIJUN [[id number]]   rizatriptan  (MAXALT -MLT) 10 MG disintegrating tablet    Sig: Take 1 tablet (10 mg total) by mouth as needed for migraine. May repeat in 2 hours if needed    Dispense:  27 tablet    Refill:  3    This is a 90-day supply do not fill early    Supervising Provider:   YAN, YIJUN [[id number]]     Follow Up Instructions:  I discussed the assessment and treatment plan with the patient. The patient was provided an opportunity to ask questions and all were answered. The patient agreed with the plan and demonstrated an understanding of the instructions.   The patient was advised to call back or seek an in-person evaluation if the symptoms worsen or if the condition fails to improve as anticipated.  I provided 15 minutes of non-face-to-face time during this encounter. Patient located at their place of residence during Mychart visit. Provider  is in the office.    Sukanya Goldblatt, NP  "

## 2024-09-22 NOTE — Patient Instructions (Addendum)
 Below is our plan:  We will continue topiramate  but increase dose to 150mg  and continue amitriptyline  75mg  daily and rizatriptan  as needed. I will order a sleep study you can do at home. Consider CPAP if needed.   Please make sure you are staying well hydrated. I recommend 50-60 ounces daily. Well balanced diet and regular exercise encouraged. Consistent sleep schedule with 6-8 hours recommended.   Please continue follow up with care team as directed.   Follow up with me pending sleep eval  You may receive a survey regarding today's visit. I encourage you to leave honest feed back as I do use this information to improve patient care. Thank you for seeing me today!   GENERAL HEADACHE INFORMATION:   Natural supplements: Magnesium Oxide or Magnesium Glycinate 500 mg at bed (up to 800 mg daily) Coenzyme Q10 300 mg in AM Vitamin B2- 200 mg twice a day   Add 1 supplement at a time since even natural supplements can have undesirable side effects. You can sometimes buy supplements cheaper (especially Coenzyme Q10) at www.webmailguide.co.za or at [facility].  Migraine with aura: There is increased risk for stroke in women with migraine with aura and a contraindication for the combined contraceptive pill for use by women who have migraine with aura. The risk for women with migraine without aura is lower. However other risk factors like smoking are far more likely to increase stroke risk than migraine. There is a recommendation for no smoking and for the use of OCPs without estrogen such as progestogen only pills particularly for women with migraine with aura.[name] People who have migraine headaches with auras may be 3 times more likely to have a stroke caused by a blood clot, compared to migraine patients who don't see auras. Women who take hormone-replacement therapy may be 30 percent more likely to suffer a clot-based stroke than women not taking medication containing estrogen. Other risk factors like smoking and high  blood pressure may be  much more important.    Vitamins and herbs that show potential:   Magnesium: Magnesium (250 mg twice a day or 500 mg at bed) has a relaxant effect on smooth muscles such as blood vessels. Individuals suffering from frequent or daily headache usually have low magnesium levels which can be increase with daily supplementation of 400-750 mg. Three trials found 40-90% average headache reduction  when used as a preventative. Magnesium may help with headaches are aura, the best evidence for magnesium is for migraine with aura is its thought to stop the cortical spreading depression we believe is the pathophysiology of migraine aura.Magnesium also demonstrated the benefit in menstrually related migraine.  Magnesium is part of the messenger system in the serotonin cascade and it is a good muscle relaxant.  It is also useful for constipation which can be a side effect of other medications used to treat migraine. Good sources include nuts, whole grains, and tomatoes. Side Effects: loose stool/diarrhea  Riboflavin (vitamin B 2) 200 mg twice a day. This vitamin assists nerve cells in the production of ATP a principal energy storing molecule.  It is necessary for many chemical reactions in the body.  There have been at least 3 clinical trials of riboflavin using 400 mg per day all of which suggested that migraine frequency can be decreased.  All 3 trials showed significant improvement in over half of migraine sufferers.  The supplement is found in bread, cereal, milk, meat, and poultry.  Most Americans get more riboflavin than the recommended  daily allowance, however riboflavin deficiency is not necessary for the supplements to help prevent headache. Side effects: energizing, green urine   Coenzyme Q10: This is present in almost all cells in the body and is critical component for the conversion of energy.  Recent studies have shown that a nutritional supplement of CoQ10 can reduce the frequency of  migraine attacks by improving the energy production of cells as with riboflavin.  Doses of 150 mg twice a day have been shown to be effective.   Melatonin: Increasing evidence shows correlation between melatonin secretion and headache conditions.  Melatonin supplementation has decreased headache intensity and duration.  It is widely used as a sleep aid.  Sleep is natures way of dealing with migraine.  A dose of 3 mg is recommended to start for headaches including cluster headache. Higher doses up to 15 mg has been reviewed for use in Cluster headache and have been used. The rationale behind using melatonin for cluster is that many theories regarding the cause of Cluster headache center around the disruption of the normal circadian rhythm in the brain.  This helps restore the normal circadian rhythm.   HEADACHE DIET: Foods and beverages which may trigger migraine Note that only 20% of headache patients are food sensitive. You will know if you are food sensitive if you get a headache consistently 20 minutes to 2 hours after eating a certain food. Only cut out a food if it causes headaches, otherwise you might remove foods you enjoy! What matters most for diet is to eat a well balanced healthy diet full of vegetables and low fat protein, and to not miss meals.   Chocolate, other sweets ALL cheeses except cottage and cream cheese Dairy products, yogurt, sour cream, ice cream Liver Meat extracts (Bovril, Marmite, meat tenderizers) Meats or fish which have undergone aging, fermenting, pickling or smoking. These include: Hotdogs,salami,Lox,sausage, mortadellas,smoked salmon, pepperoni, Pickled herring Pods of broad bean (English beans, Chinese pea pods, Italian (fava) beans, lima and navy beans Ripe avocado, ripe banana Yeast extracts or active yeast preparations such as Brewer's or Fleishman's (commercial bakes goods are permitted) Tomato based foods, pizza (lasagna, etc.)   MSG (monosodium glutamate)  is disguised as many things; look for these common aliases: Monopotassium glutamate Autolysed yeast Hydrolysed protein Sodium caseinate flavorings all natural preservatives Nutrasweet   Avoid all other foods that convincingly provoke headaches.   Resources: The Dizzy Bluford Aid Your Headache Diet, migrainestrong.com  https://zamora-andrews.com/   Caffeine and Migraine For patients that have migraine, caffeine intake more than 3 days per week can lead to dependency and increased migraine frequency. I would recommend cutting back on your caffeine intake as best you can. The recommended amount of caffeine is 200-300 mg daily, although migraine patients may experience dependency at even lower doses. While you may notice an increase in headache temporarily, cutting back will be helpful for headaches in the long run. For more information on caffeine and migraine, visit: https://americanmigrainefoundation.org/resource-library/caffeine-and-migraine/   Headache Prevention Strategies:   1. Maintain a headache diary; learn to identify and avoid triggers.  - This can be a simple note where you log when you had a headache, associated symptoms, and medications used - There are several smartphone apps developed to help track migraines: Migraine Buddy, Migraine Monitor, Curelator N1-Headache App   Common triggers include: Emotional triggers: Emotional/Upset family or friends Emotional/Upset occupation Business reversal/success Anticipation anxiety Crisis-serious Post-crisis periodNew job/position   Physical triggers: Vacation Day Weekend Strenuous Exercise High Altitude Location New Move  Menstrual Day Physical Illness Oversleep/Not enough sleep Weather changes Light: Photophobia or light sesnitivity treatment involves a balance between desensitization and reduction in overly strong input. Use dark polarized glasses outside, but not inside.  Avoid bright or fluorescent light, but do not dim environment to the point that going into a normally lit room hurts. Consider FL-41 tint lenses, which reduce the most irritating wavelengths without blocking too much light.  These can be obtained at axonoptics.com or theraspecs.com Foods: see list above.   2. Limit use of acute treatments (over-the-counter medications, triptans, etc.) to no more than 2 days per week or 10 days per month to prevent medication overuse headache (rebound headache).     3. Follow a regular schedule (including weekends and holidays): Don't skip meals. Eat a balanced diet. 8 hours of sleep nightly. Minimize stress. Exercise 30 minutes per day. Being overweight is associated with a 5 times increased risk of chronic migraine. Keep well hydrated and drink 6-8 glasses of water per day.   4. Initiate non-pharmacologic measures at the earliest onset of your headache. Rest and quiet environment. Relax and reduce stress. Breathe2Relax is a free app that can instruct you on    some simple relaxtion and breathing techniques. Http://Dawnbuse.com is a    free website that provides teaching videos on relaxation.  Also, there are  many apps that   can be downloaded for mindful relaxation.  An app called YOGA NIDRA will help walk you through mindfulness. Another app called Calm can be downloaded to give you a structured mindfulness guide with daily reminders and skill development. Headspace for guided meditation Mindfulness Based Stress Reduction Online Course: www.palousemindfulness.com Cold compresses.   5. Don't wait!! Take the maximum allowable dosage of prescribed medication at the first sign of migraine.   6. Compliance:  Take prescribed medication regularly as directed and at the first sign of a migraine.   7. Communicate:  Call your physician when problems arise, especially if your headaches change, increase in frequency/severity, or become associated with neurological  symptoms (weakness, numbness, slurred speech, etc.). Proceed to emergency room if you experience new or worsening symptoms or symptoms do not resolve, if you have new neurologic symptoms or if headache is severe, or for any concerning symptom.   8. Headache/pain management therapies: Consider various complementary methods, including medication, behavioral therapy, psychological counselling, biofeedback, massage therapy, acupuncture, dry needling, and other modalities.  Such measures may reduce the need for medications. Counseling for pain management, where patients learn to function and ignore/minimize their pain, seems to work very well.   9. Recommend changing family's attention and focus away from patient's headaches. Instead, emphasize daily activities. If first question of day is 'How are your headaches/Do you have a headache today?', then patient will constantly think about headaches, thus making them worse. Goal is to re-direct attention away from headaches, toward daily activities and other distractions.   10. Helpful Websites: www.AmericanHeadacheSociety.org patenthood.ch www.headaches.org tightmarket.nl www.achenet.org
